# Patient Record
Sex: Female | Born: 1995
Health system: Southern US, Community
[De-identification: ages and names within clinical notes are randomized; demographics above are authoritative.]

## PROBLEM LIST (undated history)

## (undated) DIAGNOSIS — F909 Attention-deficit hyperactivity disorder, unspecified type: Secondary | ICD-10-CM

## (undated) DIAGNOSIS — E669 Obesity, unspecified: Secondary | ICD-10-CM

## (undated) DIAGNOSIS — J189 Pneumonia, unspecified organism: Secondary | ICD-10-CM

## (undated) DIAGNOSIS — E282 Polycystic ovarian syndrome: Secondary | ICD-10-CM

## (undated) DIAGNOSIS — Z87442 Personal history of urinary calculi: Secondary | ICD-10-CM

## (undated) HISTORY — DX: Obesity, unspecified: E66.9

## (undated) HISTORY — PX: WISDOM TOOTH EXTRACTION: SHX21

---

## 2018-09-27 ENCOUNTER — Emergency Department: Payer: 59

## 2018-09-27 ENCOUNTER — Emergency Department
Admission: EM | Admit: 2018-09-27 | Discharge: 2018-09-27 | Disposition: A | Discharge status: Home / Self Care | Payer: 59 | Attending: Emergency Medicine | Admitting: Emergency Medicine

## 2018-09-27 ENCOUNTER — Other Ambulatory Visit: Payer: Self-pay

## 2018-09-27 DIAGNOSIS — R1084 Generalized abdominal pain: Secondary | ICD-10-CM | POA: Insufficient documentation

## 2018-09-27 DIAGNOSIS — R112 Nausea with vomiting, unspecified: Secondary | ICD-10-CM

## 2018-09-27 DIAGNOSIS — E860 Dehydration: Secondary | ICD-10-CM

## 2018-09-27 DIAGNOSIS — R197 Diarrhea, unspecified: Secondary | ICD-10-CM | POA: Insufficient documentation

## 2018-09-27 DIAGNOSIS — A0472 Enterocolitis due to Clostridium difficile, not specified as recurrent: Secondary | ICD-10-CM

## 2018-09-27 LAB — GASTROINTESTINAL PANEL BY PCR, STOOL (REPLACES STOOL CULTURE)

## 2018-09-27 LAB — URINALYSIS, COMPLETE (UACMP) WITH MICROSCOPIC
Bilirubin Urine: NEGATIVE
Glucose, UA: NEGATIVE mg/dL
HGB URINE DIPSTICK: NEGATIVE
Ketones, ur: 5 mg/dL — AB
Leukocytes,Ua: NEGATIVE
Nitrite: NEGATIVE
Protein, ur: NEGATIVE mg/dL
Specific Gravity, Urine: 1.025 (ref 1.005–1.030)
pH: 5 (ref 5.0–8.0)

## 2018-09-27 LAB — POCT PREGNANCY, URINE: Preg Test, Ur: NEGATIVE

## 2018-09-27 LAB — C DIFFICILE QUICK SCREEN W PCR REFLEX
C Diff antigen: POSITIVE — AB
C Diff toxin: NEGATIVE

## 2018-09-27 LAB — CBC
HEMATOCRIT: 38.5 % (ref 36.0–46.0)
Hemoglobin: 12.6 g/dL (ref 12.0–15.0)
MCH: 29.3 pg (ref 26.0–34.0)
MCHC: 32.7 g/dL (ref 30.0–36.0)
MCV: 89.5 fL (ref 80.0–100.0)
Platelets: 228 10*3/uL (ref 150–400)
RBC: 4.3 MIL/uL (ref 3.87–5.11)
RDW: 12.9 % (ref 11.5–15.5)
WBC: 4.4 10*3/uL (ref 4.0–10.5)
nRBC: 0 % (ref 0.0–0.2)

## 2018-09-27 LAB — COMPREHENSIVE METABOLIC PANEL
ALBUMIN: 3.4 g/dL — AB (ref 3.5–5.0)
ALT: 49 U/L — AB (ref 0–44)
AST: 29 U/L (ref 15–41)
Alkaline Phosphatase: 63 U/L (ref 38–126)
Anion gap: 7 (ref 5–15)
BUN: 8 mg/dL (ref 6–20)
CO2: 22 mmol/L (ref 22–32)
Calcium: 8.9 mg/dL (ref 8.9–10.3)
Chloride: 107 mmol/L (ref 98–111)
Creatinine, Ser: 0.82 mg/dL (ref 0.44–1.00)
GFR calc Af Amer: >60 mL/min (ref >60)
GFR calc non Af Amer: >60 mL/min (ref >60)
GLUCOSE: 151 mg/dL — AB (ref 70–99)
Potassium: 3.6 mmol/L (ref 3.5–5.1)
Sodium: 136 mmol/L (ref 135–145)
Total Bilirubin: 0.2 mg/dL — ABNORMAL LOW (ref 0.3–1.2)
Total Protein: 7.2 g/dL (ref 6.5–8.1)

## 2018-09-27 LAB — CLOSTRIDIUM DIFFICILE BY PCR, REFLEXED: Toxigenic C. Difficile by PCR: NEGATIVE

## 2018-09-27 MED ORDER — IOHEXOL 300 MG/ML  SOLN
100.0000 mL | Freq: Once | INTRAMUSCULAR | Status: AC | PRN
  Administered 2018-09-27: 100 mL via INTRAVENOUS

## 2018-09-27 MED ORDER — ONDANSETRON HCL 4 MG/2ML IJ SOLN
INTRAMUSCULAR | Status: AC
  Administered 2018-09-27: 4 mg via INTRAVENOUS
  Filled 2018-09-27: qty 2

## 2018-09-27 MED ORDER — DICYCLOMINE HCL 20 MG PO TABS: 20.0000 mg | ORAL_TABLET | Freq: Four times a day (QID) | ORAL | 0 refills | Status: DC | PRN

## 2018-09-27 MED ORDER — ONDANSETRON HCL 4 MG/2ML IJ SOLN
4.0000 mg | Freq: Once | INTRAMUSCULAR | Status: AC
  Administered 2018-09-27: 4 mg via INTRAVENOUS
  Filled 2018-09-27: qty 2

## 2018-09-27 MED ORDER — ONDANSETRON 4 MG PO TBDP: 4.0000 mg | ORAL_TABLET | Freq: Three times a day (TID) | ORAL | 0 refills | Status: DC | PRN

## 2018-09-27 MED ORDER — METRONIDAZOLE 500 MG PO TABS: 500.0000 mg | ORAL_TABLET | Freq: Three times a day (TID) | ORAL | 0 refills | Status: AC

## 2018-09-27 MED ORDER — IOPAMIDOL (ISOVUE-300) INJECTION 61%
30.0000 mL | Freq: Once | INTRAVENOUS | Status: AC | PRN
  Administered 2018-09-27: 30 mL via ORAL

## 2018-09-27 MED ORDER — ONDANSETRON HCL 4 MG/2ML IJ SOLN
4.0000 mg | Freq: Once | INTRAMUSCULAR | Status: AC
  Administered 2018-09-27: 4 mg via INTRAVENOUS

## 2018-09-27 MED ORDER — SODIUM CHLORIDE 0.9 % IV BOLUS
1000.0000 mL | Freq: Once | INTRAVENOUS | Status: AC
  Administered 2018-09-27: 1000 mL via INTRAVENOUS

## 2018-09-27 NOTE — ED Notes (Signed)
Pt small emesis, CT called for finished drinking contrast

## 2018-09-27 NOTE — ED Provider Notes (Signed)
-----------------------------------------   8:11 AM on 09/27/2018 -----------------------------------------  Patient's results have returned positive for C. difficile antigen although negative for C. difficile toxin.  Normal white blood cell count.  However given her diarrhea we will cover the patient with metronidazole 3 times daily x10 days.  Patient agreeable to plan of care.  I discussed return precautions.   Minna Antis, MD 09/27/18 [phone]

## 2018-09-27 NOTE — Discharge Instructions (Addendum)
1.  You may take medicines as needed for abdominal cramps and nausea/vomiting (Bentyl/Zofran #20). 2.  Clear liquids x12 hours, then [employer] x3 days, then slowly advance diet as tolerated. 3.  Return to the ER for worsening symptoms, persistent vomiting, difficulty breathing or other concerns.

## 2018-09-27 NOTE — ED Triage Notes (Signed)
Pt states 4 days of nausea, vomiting and diarrhea. Pt states she has had a fever at home. Pt states she was seen by PMD and given nausea medication that is not helping. Pt appears in no acute distress.

## 2018-09-27 NOTE — ED Notes (Signed)
Patient transported to CT 

## 2018-09-27 NOTE — ED Notes (Signed)
Pt c/o N/V/D/cough/fever (no temp)/chills/poor appetite for the last four days  Pt has been drinking Pedialyte and taking amoxicillin for one day from [facility] care d/t cough

## 2018-09-27 NOTE — ED Provider Notes (Signed)
[facility] Emergency Department Provider Note   ____________________________________________   First MD Initiated Contact with Patient 09/27/18 [phone]     (approximate)  I have reviewed the triage vital signs and the nursing notes.   HISTORY  Chief Complaint Emesis and Diarrhea    HPI Jaime Hernandez is a 23 y.o. female who presents to the ED from home with a chief complaint of abdominal cramps, nausea/vomiting/diarrhea.  Patient had cold-like symptoms last week.  Endorses a 4-day history of the above symptoms.  Also endorses subjective fevers.  States she was seen by her PCP, started on amoxicillin (does not know why) and given some nausea medicine that she does not recall which does not help her nausea.  Last emesis around 1 AM.  Last episode of diarrhea approximately 1 hour ago.  Denies chest pain, shortness of breath, dysuria.  Denies recent travel or trauma.    Past medical history None  There are no active problems to display for this patient.    Prior to Admission medications   Medication Sig Start Date End Date Taking? Authorizing Provider  dicyclomine (BENTYL) 20 MG tablet Take 1 tablet (20 mg total) by mouth every 6 (six) hours as needed. 09/27/18   Irean Hong, MD  ondansetron (ZOFRAN ODT) 4 MG disintegrating tablet Take 1 tablet (4 mg total) by mouth every 8 (eight) hours as needed for nausea or vomiting. 09/27/18   Irean Hong, MD    Allergies Patient has no known allergies.  No family history on file.  Social History Social History   Tobacco Use  . Smoking status: Not on file  Substance Use Topics  . Alcohol use: Not on file  . Drug use: Not on file    Review of Systems  Constitutional: No fever/chills Eyes: No visual changes. ENT: No sore throat. Cardiovascular: Denies chest pain. Respiratory: Denies shortness of breath. Gastrointestinal: Positive for abdominal pain, nausea, vomiting and diarrhea.  No  constipation. Genitourinary: Negative for dysuria. Musculoskeletal: Negative for back pain. Skin: Negative for rash. Neurological: Negative for headaches, focal weakness or numbness.   ____________________________________________   PHYSICAL EXAM:  VITAL SIGNS: ED Triage Vitals [09/27/18 0139]  Enc Vitals Group     BP 126/71     Pulse Rate 88     Resp 16     Temp 98.5 F (36.9 C)     Temp Source Oral     SpO2 100 %     Weight 212 lb (96.2 kg)     Height 5\' 5"  (1.651 m)     Head Circumference      Peak Flow      Pain Score 7     Pain Loc      Pain Edu?      Excl. in [place]?     Constitutional: Alert and oriented. Well appearing and in mild acute distress. Eyes: Conjunctivae are normal. PERRL. EOMI. Head: Atraumatic. Nose: No congestion/rhinnorhea. Mouth/Throat: Mucous membranes are moist.  Oropharynx non-erythematous. Neck: No stridor.   Cardiovascular: Normal rate, regular rhythm. Grossly normal heart sounds.  Good peripheral circulation. Respiratory: Normal respiratory effort.  No retractions. Lungs CTAB. Gastrointestinal: Soft and mildly diffusely tender to palpation without rebound or guarding. No distention. No abdominal bruits. No CVA tenderness. Musculoskeletal: No lower extremity tenderness nor edema.  No joint effusions. Neurologic:  Normal speech and language. No gross focal neurologic deficits are appreciated. No gait instability. Skin:  Skin is warm, dry and intact. No rash  noted. Psychiatric: Mood and affect are normal. Speech and behavior are normal.  ____________________________________________   LABS (all labs ordered are listed, but only abnormal results are displayed)  Labs Reviewed  COMPREHENSIVE METABOLIC PANEL - Abnormal; Notable for the following components:      Result Value   Glucose, Bld 151 (*)    Albumin 3.4 (*)    ALT 49 (*)    Total Bilirubin 0.2 (*)    All other components within normal limits  URINALYSIS, COMPLETE (UACMP) WITH  MICROSCOPIC - Abnormal; Notable for the following components:   Color, Urine AMBER (*)    APPearance HAZY (*)    Ketones, ur 5 (*)    Bacteria, UA FEW (*)    All other components within normal limits  C DIFFICILE QUICK SCREEN W PCR REFLEX  GASTROINTESTINAL PANEL BY PCR, STOOL (REPLACES STOOL CULTURE)  CBC  POC URINE PREG, ED  POCT PREGNANCY, URINE   ____________________________________________  EKG  None ____________________________________________  RADIOLOGY  ED MD interpretation: Mild enteritis  Official radiology report(s): Ct Abdomen Pelvis W Contrast  Result Date: 09/27/2018 CLINICAL DATA:  23 year old female with abdominal cramping. EXAM: CT ABDOMEN AND PELVIS WITH CONTRAST TECHNIQUE: Multidetector CT imaging of the abdomen and pelvis was performed using the standard protocol following bolus administration of intravenous contrast. CONTRAST:  OMNIPAQUE IOHEXOL 300 MG/ML  SOLN COMPARISON:  None. FINDINGS: Lower chest: The visualized lung bases are clear. No intra-abdominal free air or free fluid. Hepatobiliary: No focal liver abnormality is seen. No gallstones, gallbladder wall thickening, or biliary dilatation. Pancreas: Unremarkable. No pancreatic ductal dilatation or surrounding inflammatory changes. Spleen: Normal in size without focal abnormality. Adrenals/Urinary Tract: Adrenal glands are unremarkable. Kidneys are normal, without renal calculi, focal lesion, or hydronephrosis. Bladder is unremarkable. Stomach/Bowel: There is no bowel obstruction or active inflammation. There is apparent minimal engorgement of the mesenteric vasculature which may be reactive. Clinical correlation is recommended to exclude mild enteritis. The appendix is normal. Vascular/Lymphatic: No significant vascular findings are present. No enlarged abdominal or pelvic lymph nodes. Reproductive: The uterus and ovaries are grossly unremarkable. No pelvic mass. Other: None Musculoskeletal: No acute or  significant osseous findings. IMPRESSION: Findings may represent mild enteritis. Clinical correlation is recommended. No bowel obstruction. Normal appendix. Electronically Signed   By: Elgie Collard M.D.   On: 09/27/2018 06:02    ____________________________________________   PROCEDURES  Procedure(s) performed: None  Procedures  Critical Care performed: No  ____________________________________________   INITIAL IMPRESSION / ASSESSMENT AND PLAN / ED COURSE  As part of my medical decision making, I reviewed the following data within the electronic MEDICAL RECORD NUMBER History obtained from family, Nursing notes reviewed and incorporated, Labs reviewed, Old chart reviewed, Radiograph reviewed and Notes from prior ED visits    23 year old otherwise healthy female who presents with a 4-day history of abdominal cramping, nausea/vomiting/diarrhea. Differential diagnosis includes, but is not limited to, ovarian cyst, ovarian torsion, acute appendicitis, diverticulitis, urinary tract infection/pyelonephritis, endometriosis, bowel obstruction, colitis, renal colic, gastroenteritis, hernia, etc.  Laboratory results remarkable for mild dehydration.  Will initiate IV fluid resuscitation, 4 mg IV Zofran for nausea and proceed with CT abdomen/pelvis to evaluate intra-abdominal etiology of patient's symptoms.   Clinical Course as of Sep 27 654  Sat Sep 27, 2018  1610 Patient vomited prior to going to CT scan.  He was redosed with IV Zofran.  Awaiting CT results.   [JS]  0607 Updated patient of CT results.  She has not been able  to provide stool specimen.  Will try ice chips.   [JS]  [id number] Patient tolerated ice chips without emesis.  She has provided stool specimen.  I did asked patient to at least wait for the results of C. difficile.  If she wants to be discharged before results of [employer] I think that is reasonable as that takes longer to result.   [JS]  [id number] Transferred care of patient to Dr.  Lenard Lance pending C. difficile results.   [JS]    Clinical Course User Index [JS] Irean Hong, MD     ____________________________________________   FINAL CLINICAL IMPRESSION(S) / ED DIAGNOSES  Final diagnoses:  Nausea vomiting and diarrhea  Dehydration  Generalized abdominal pain     ED Discharge Orders         Ordered    dicyclomine (BENTYL) 20 MG tablet  Every 6 hours PRN     09/27/18 0637    ondansetron (ZOFRAN ODT) 4 MG disintegrating tablet  Every 8 hours PRN     09/27/18 2620           Note:  This document was prepared using Dragon voice recognition software and may include unintentional dictation errors.   Irean Hong, MD 09/27/18 [phone]

## 2019-03-02 ENCOUNTER — Other Ambulatory Visit: Payer: Self-pay

## 2019-03-02 DIAGNOSIS — Z20822 Contact with and (suspected) exposure to covid-19: Secondary | ICD-10-CM

## 2019-03-04 LAB — NOVEL CORONAVIRUS, NAA: SARS-CoV-2, NAA: NOT DETECTED

## 2019-09-20 ENCOUNTER — Encounter: Payer: Self-pay | Admitting: *Deleted

## 2019-09-20 ENCOUNTER — Other Ambulatory Visit: Payer: Self-pay

## 2019-09-20 DIAGNOSIS — R197 Diarrhea, unspecified: Secondary | ICD-10-CM | POA: Diagnosis present

## 2019-09-20 DIAGNOSIS — Z20822 Contact with and (suspected) exposure to covid-19: Secondary | ICD-10-CM | POA: Insufficient documentation

## 2019-09-20 DIAGNOSIS — R112 Nausea with vomiting, unspecified: Secondary | ICD-10-CM | POA: Diagnosis not present

## 2019-09-20 DIAGNOSIS — B349 Viral infection, unspecified: Secondary | ICD-10-CM | POA: Insufficient documentation

## 2019-09-20 DIAGNOSIS — R509 Fever, unspecified: Secondary | ICD-10-CM | POA: Insufficient documentation

## 2019-09-20 LAB — CBC
HCT: 41.6 % (ref 36.0–46.0)
Hemoglobin: 13.7 g/dL (ref 12.0–15.0)
MCH: 29.8 pg (ref 26.0–34.0)
MCHC: 32.9 g/dL (ref 30.0–36.0)
MCV: 90.6 fL (ref 80.0–100.0)
Platelets: 272 10*3/uL (ref 150–400)
RBC: 4.59 MIL/uL (ref 3.87–5.11)
RDW: 12.9 % (ref 11.5–15.5)
WBC: 11.8 10*3/uL — ABNORMAL HIGH (ref 4.0–10.5)
nRBC: 0 % (ref 0.0–0.2)

## 2019-09-20 LAB — COMPREHENSIVE METABOLIC PANEL
ALT: 21 U/L (ref 0–44)
AST: 19 U/L (ref 15–41)
Albumin: 3.7 g/dL (ref 3.5–5.0)
Alkaline Phosphatase: 71 U/L (ref 38–126)
Anion gap: 9 (ref 5–15)
BUN: 15 mg/dL (ref 6–20)
CO2: 22 mmol/L (ref 22–32)
Calcium: 8.9 mg/dL (ref 8.9–10.3)
Chloride: 106 mmol/L (ref 98–111)
Creatinine, Ser: 0.69 mg/dL (ref 0.44–1.00)
GFR calc Af Amer: >60 mL/min (ref >60)
GFR calc non Af Amer: >60 mL/min (ref >60)
Glucose, Bld: 111 mg/dL — ABNORMAL HIGH (ref 70–99)
Potassium: 4.1 mmol/L (ref 3.5–5.1)
Sodium: 137 mmol/L (ref 135–145)
Total Bilirubin: 0.5 mg/dL (ref 0.3–1.2)
Total Protein: 7.4 g/dL (ref 6.5–8.1)

## 2019-09-20 LAB — URINALYSIS, COMPLETE (UACMP) WITH MICROSCOPIC
Bilirubin Urine: NEGATIVE
Glucose, UA: NEGATIVE mg/dL
Hgb urine dipstick: NEGATIVE
Ketones, ur: NEGATIVE mg/dL
Nitrite: NEGATIVE
Protein, ur: NEGATIVE mg/dL
Specific Gravity, Urine: 1.024 (ref 1.005–1.030)
Squamous Epithelial / HPF: NONE SEEN (ref 0–5)
pH: 5 (ref 5.0–8.0)

## 2019-09-20 LAB — LIPASE, BLOOD: Lipase: 22 U/L (ref 11–51)

## 2019-09-20 MED ORDER — ONDANSETRON 4 MG PO TBDP: 4.0000 mg | ORAL_TABLET | Freq: Once | ORAL | Status: AC

## 2019-09-20 MED ORDER — ONDANSETRON 4 MG PO TBDP
ORAL_TABLET | ORAL | Status: AC
  Administered 2019-09-20: 4 mg via ORAL
  Filled 2019-09-20: qty 1

## 2019-09-20 NOTE — ED Notes (Signed)
Pt reporting last Zofran was taken at 15:00 today. Pt vomiting in the bathroom and reporting feeling lightheaded. Pt placed in a wheelchair and reporting she feels better sitting. Medication given.

## 2019-09-20 NOTE — ED Triage Notes (Signed)
PT to ED reporting sudden onset NVD today. Pt reports eating out last night and had a rare steak but did not feel bad immediatly after and did not feel as though it was bad. No one else that ate dinner was sick. Boyfriend has been coughing. Pt took zofran at home with slight relief. Chills but no known fevers at home.   No changes in urine

## 2019-09-21 ENCOUNTER — Emergency Department
Admission: EM | Admit: 2019-09-21 | Discharge: 2019-09-21 | Disposition: A | Discharge status: Home / Self Care | Payer: 59 | Attending: Emergency Medicine | Admitting: Emergency Medicine

## 2019-09-21 DIAGNOSIS — R112 Nausea with vomiting, unspecified: Secondary | ICD-10-CM

## 2019-09-21 LAB — POC SARS CORONAVIRUS 2 AG: SARS Coronavirus 2 Ag: NEGATIVE

## 2019-09-21 LAB — POCT PREGNANCY, URINE: Preg Test, Ur: NEGATIVE

## 2019-09-21 LAB — PREGNANCY, URINE: Preg Test, Ur: NEGATIVE

## 2019-09-21 LAB — SARS CORONAVIRUS 2 (TAT 6-24 HRS): SARS Coronavirus 2: NEGATIVE

## 2019-09-21 MED ORDER — ONDANSETRON 4 MG PO TBDP: 4.0000 mg | ORAL_TABLET | Freq: Three times a day (TID) | ORAL | 0 refills | Status: DC | PRN

## 2019-09-21 NOTE — ED Provider Notes (Signed)
[facility] Emergency Department Provider Note  Time seen: 2:31 AM  I have reviewed the triage vital signs and the nursing notes.   HISTORY  Chief Complaint Diarrhea    HPI Jaime Hernandez is a 24 y.o. female with no past medical history presents to the emergency department for 2days of subjective fever nausea vomiting diarrhea.  According to the patient for the past 2 days or so she has had an occasional cough, over the past 12 hours has developed nausea vomiting diarrhea and subjective fevers.  States her boyfriend at home has similar symptoms as well.  Patient has not measured a temperature at home, 99.6 in the emergency department.  States intermittent mild headaches as well.   History reviewed. No pertinent past medical history.  There are no problems to display for this patient.   History reviewed. No pertinent surgical history.  Prior to Admission medications   Medication Sig Start Date End Date Taking? Authorizing Provider  dicyclomine (BENTYL) 20 MG tablet Take 1 tablet (20 mg total) by mouth every 6 (six) hours as needed. 09/27/18   Paulette Blanch, MD  ondansetron (ZOFRAN ODT) 4 MG disintegrating tablet Take 1 tablet (4 mg total) by mouth every 8 (eight) hours as needed for nausea or vomiting. 09/27/18   Paulette Blanch, MD    No Known Allergies  History reviewed. No pertinent family history.  Social History Social History   Tobacco Use  . Smoking status: Never Smoker  . Smokeless tobacco: Never Used  Substance Use Topics  . Alcohol use: Yes    Comment: socially  . Drug use: Never    Review of Systems Constitutional: Subjective fever/chills Cardiovascular: Negative for chest pain. Respiratory: Negative for shortness of breath. Gastrointestinal: Negative for abdominal pain.  Positive for nausea vomiting diarrhea Musculoskeletal: Negative for musculoskeletal complaints Neurological: Intermittent headaches. All other ROS  negative  ____________________________________________   PHYSICAL EXAM:  VITAL SIGNS: ED Triage Vitals  Enc Vitals Group     BP 09/20/19 2204 115/71     Pulse Rate 09/20/19 2204 (!) 108     Resp 09/20/19 2204 16     Temp 09/20/19 2204 99.6 F (37.6 C)     Temp Source 09/20/19 2204 Oral     SpO2 09/20/19 2204 100 %     Weight 09/20/19 2205 212 lb (96.2 kg)     Height 09/20/19 2205 5\' 5"  (1.651 m)     Head Circumference --      Peak Flow --      Pain Score 09/20/19 2204 7     Pain Loc --      Pain Edu? --      Excl. in [place]? --     Constitutional: Alert and oriented. Well appearing and in no distress. Eyes: Normal exam ENT      Head: Normocephalic and atraumatic.      Mouth/Throat: Mucous membranes are moist. Cardiovascular: Normal rate, regular rhythm.  Respiratory: Normal respiratory effort without tachypnea nor retractions. Breath sounds are clear  Gastrointestinal: Soft and nontender. No distention.   Musculoskeletal: Nontender with normal range of motion in all extremities. Neurologic:  Normal speech and language. No gross focal neurologic deficits Skin:  Skin is warm, dry and intact.  Psychiatric: Mood and affect are normal.   ____________________________________________   INITIAL IMPRESSION / ASSESSMENT AND PLAN / ED COURSE  Pertinent labs & imaging results that were available during my care of the patient were reviewed by me and  considered in my medical decision making (see chart for details).   Patient presents to the emergency department for nausea vomiting diarrhea subjective chills, intermittent headaches.  Patient is concerned she could have Covid.  Patient's labs are largely within normal limits.  Her rapid Covid is negative.  Patient's physical exam and vitals are reassuring.  Overall clinical picture is consistent with a viral process.  We will send a Covid PCR, discharge with supportive care and Zofran.  Discussed quarantine/isolation precautions until her  PCR results are known.  Jaime Hernandez was evaluated in Emergency Department on 09/21/2019 for the symptoms described in the history of present illness. She was evaluated in the context of the global COVID-19 pandemic, which necessitated consideration that the patient might be at risk for infection with the SARS-CoV-2 virus that causes COVID-19. Institutional protocols and algorithms that pertain to the evaluation of patients at risk for COVID-19 are in a state of rapid change based on information released by regulatory bodies including the CDC and federal and state organizations. These policies and algorithms were followed during the patient's care in the ED.  ____________________________________________   FINAL CLINICAL IMPRESSION(S) / ED DIAGNOSES  Viral illness    Jaime Antis, MD 09/21/19 [phone]

## 2020-06-08 ENCOUNTER — Encounter: Payer: Self-pay | Admitting: Emergency Medicine

## 2020-06-08 ENCOUNTER — Other Ambulatory Visit: Payer: Self-pay

## 2020-06-08 DIAGNOSIS — N83202 Unspecified ovarian cyst, left side: Secondary | ICD-10-CM | POA: Diagnosis not present

## 2020-06-08 DIAGNOSIS — N83201 Unspecified ovarian cyst, right side: Secondary | ICD-10-CM | POA: Insufficient documentation

## 2020-06-08 DIAGNOSIS — R102 Pelvic and perineal pain: Secondary | ICD-10-CM | POA: Diagnosis present

## 2020-06-08 LAB — COMPREHENSIVE METABOLIC PANEL
ALT: 19 U/L (ref 0–44)
AST: 17 U/L (ref 15–41)
Albumin: 3.6 g/dL (ref 3.5–5.0)
Alkaline Phosphatase: 77 U/L (ref 38–126)
Anion gap: 9 (ref 5–15)
BUN: 10 mg/dL (ref 6–20)
CO2: 24 mmol/L (ref 22–32)
Calcium: 8.9 mg/dL (ref 8.9–10.3)
Chloride: 107 mmol/L (ref 98–111)
Creatinine, Ser: 0.67 mg/dL (ref 0.44–1.00)
GFR, Estimated: >60 mL/min (ref >60)
Glucose, Bld: 140 mg/dL — ABNORMAL HIGH (ref 70–99)
Potassium: 3.8 mmol/L (ref 3.5–5.1)
Sodium: 140 mmol/L (ref 135–145)
Total Bilirubin: 0.4 mg/dL (ref 0.3–1.2)
Total Protein: 7.4 g/dL (ref 6.5–8.1)

## 2020-06-08 LAB — CBC
HCT: 38.9 % (ref 36.0–46.0)
Hemoglobin: 12.6 g/dL (ref 12.0–15.0)
MCH: 29.3 pg (ref 26.0–34.0)
MCHC: 32.4 g/dL (ref 30.0–36.0)
MCV: 90.5 fL (ref 80.0–100.0)
Platelets: 308 10*3/uL (ref 150–400)
RBC: 4.3 MIL/uL (ref 3.87–5.11)
RDW: 13.4 % (ref 11.5–15.5)
WBC: 12.2 10*3/uL — ABNORMAL HIGH (ref 4.0–10.5)
nRBC: 0 % (ref 0.0–0.2)

## 2020-06-08 LAB — POC URINE PREG, ED: Preg Test, Ur: NEGATIVE

## 2020-06-08 LAB — URINALYSIS, COMPLETE (UACMP) WITH MICROSCOPIC
Bilirubin Urine: NEGATIVE
Glucose, UA: NEGATIVE mg/dL
Hgb urine dipstick: NEGATIVE
Ketones, ur: NEGATIVE mg/dL
Leukocytes,Ua: NEGATIVE
Nitrite: NEGATIVE
Protein, ur: NEGATIVE mg/dL
Specific Gravity, Urine: 1.024 (ref 1.005–1.030)
pH: 5 (ref 5.0–8.0)

## 2020-06-08 LAB — LIPASE, BLOOD: Lipase: 23 U/L (ref 11–51)

## 2020-06-08 NOTE — ED Triage Notes (Signed)
Patient states that she has PCOS. Patient with complaint of lower abdominal pain with nausea times week and half. Patient denies vomiting or urinary symptoms.

## 2020-06-09 ENCOUNTER — Emergency Department
Admission: EM | Admit: 2020-06-09 | Discharge: 2020-06-09 | Disposition: A | Discharge status: Home / Self Care | Payer: 59 | Attending: Emergency Medicine | Admitting: Emergency Medicine

## 2020-06-09 ENCOUNTER — Emergency Department: Payer: 59

## 2020-06-09 DIAGNOSIS — N83201 Unspecified ovarian cyst, right side: Secondary | ICD-10-CM

## 2020-06-09 DIAGNOSIS — R52 Pain, unspecified: Secondary | ICD-10-CM

## 2020-06-09 DIAGNOSIS — R102 Pelvic and perineal pain: Secondary | ICD-10-CM

## 2020-06-09 DIAGNOSIS — E282 Polycystic ovarian syndrome: Secondary | ICD-10-CM

## 2020-06-09 HISTORY — DX: Polycystic ovarian syndrome: E28.2

## 2020-06-09 MED ORDER — OXYCODONE-ACETAMINOPHEN 5-325 MG PO TABS
1.0000 | ORAL_TABLET | Freq: Once | ORAL | Status: AC
  Administered 2020-06-09: 1 via ORAL
  Filled 2020-06-09: qty 1

## 2020-06-09 MED ORDER — ONDANSETRON 4 MG PO TBDP
4.0000 mg | ORAL_TABLET | Freq: Once | ORAL | Status: AC
  Administered 2020-06-09: 4 mg via ORAL
  Filled 2020-06-09: qty 1

## 2020-06-09 MED ORDER — ONDANSETRON 4 MG PO TBDP: 4.0000 mg | ORAL_TABLET | Freq: Three times a day (TID) | ORAL | 0 refills | Status: DC | PRN

## 2020-06-09 MED ORDER — IBUPROFEN 800 MG PO TABS
800.0000 mg | ORAL_TABLET | Freq: Once | ORAL | Status: AC
  Administered 2020-06-09: 800 mg via ORAL
  Filled 2020-06-09: qty 1

## 2020-06-09 MED ORDER — OXYCODONE-ACETAMINOPHEN 5-325 MG PO TABS: 1.0000 | ORAL_TABLET | ORAL | 0 refills | Status: DC | PRN

## 2020-06-09 MED ORDER — IBUPROFEN 800 MG PO TABS: 800.0000 mg | ORAL_TABLET | Freq: Three times a day (TID) | ORAL | 0 refills | Status: DC | PRN

## 2020-06-09 NOTE — ED Provider Notes (Signed)
[facility] Emergency Department Provider Note   ____________________________________________   First MD Initiated Contact with Patient 06/09/20 [phone]     (approximate)  I have reviewed the triage vital signs and the nursing notes.   HISTORY  Chief Complaint Abdominal Pain    HPI Jaime Hernandez is a 24 y.o. female who presents to the ED from work with a chief complaint of pelvic pain.  Patient reports a history of PCOS.  Complains of pelvic pain times several weeks associated with nausea.  Denies associated fever, cough, chest pain, shortness of breath, abdominal pain, vomiting, dysuria, diarrhea.  Denies vaginal bleeding.  Patient was seen at urgent care on 05/17/2020.  Ran out of her OCP approximately 1 month prior to urgent care visit which is when her pelvic pain started.  She was restarted on Junel at that visit.     Past Medical History:  Diagnosis Date  . PCOS (polycystic ovarian syndrome)   Kidney stones  There are no problems to display for this patient.   Past Surgical History:  Procedure Laterality Date  . WISDOM TOOTH EXTRACTION      Prior to Admission medications   Medication Sig Start Date End Date Taking? Authorizing Provider  dicyclomine (BENTYL) 20 MG tablet Take 1 tablet (20 mg total) by mouth every 6 (six) hours as needed. 09/27/18   Irean Hong, MD  ibuprofen (ADVIL) 800 MG tablet Take 1 tablet (800 mg total) by mouth every 8 (eight) hours as needed for moderate pain. 06/09/20   Irean Hong, MD  ondansetron (ZOFRAN ODT) 4 MG disintegrating tablet Take 1 tablet (4 mg total) by mouth every 8 (eight) hours as needed for nausea or vomiting. 06/09/20   Irean Hong, MD  oxyCODONE-acetaminophen (PERCOCET/ROXICET) 5-325 MG tablet Take 1 tablet by mouth every 4 (four) hours as needed for severe pain. 06/09/20   Irean Hong, MD    Allergies Tylenol with codeine #3 [acetaminophen-codeine]  No family history on file.  Social  History Social History   Tobacco Use  . Smoking status: Never Smoker  . Smokeless tobacco: Never Used  Substance Use Topics  . Alcohol use: Yes    Comment: socially  . Drug use: Never    Review of Systems  Constitutional: No fever/chills Eyes: No visual changes. ENT: No sore throat. Cardiovascular: Denies chest pain. Respiratory: Denies shortness of breath. Gastrointestinal: No abdominal pain.  Positive for nausea, no vomiting.  No diarrhea.  No constipation. Genitourinary: Positive for pelvic pain.  Negative for dysuria. Musculoskeletal: Negative for back pain. Skin: Negative for rash. Neurological: Negative for headaches, focal weakness or numbness.   ____________________________________________   PHYSICAL EXAM:  VITAL SIGNS: ED Triage Vitals  Enc Vitals Group     BP 06/08/20 1932 137/78     Pulse Rate 06/08/20 1932 97     Resp 06/08/20 1932 18     Temp 06/08/20 1932 98.1 F (36.7 C)     Temp Source 06/08/20 1932 Oral     SpO2 06/08/20 1932 100 %     Weight 06/08/20 1933 220 lb (99.8 kg)     Height 06/08/20 1933 5\' 5"  (1.651 m)     Head Circumference --      Peak Flow --      Pain Score 06/08/20 1933 7     Pain Loc --      Pain Edu? --      Excl. in GC? --  Constitutional: Alert and oriented. Well appearing and in mild acute distress. Eyes: Conjunctivae are normal. PERRL. EOMI. Head: Atraumatic. Nose: No congestion/rhinnorhea. Mouth/Throat: Mucous membranes are moist.   Neck: No stridor.   Cardiovascular: Normal rate, regular rhythm. Grossly normal heart sounds.  Good peripheral circulation. Respiratory: Normal respiratory effort.  No retractions. Lungs CTAB. Gastrointestinal: Obese.  Soft and mildly tender across pelvis without rebound or guarding. No distention. No abdominal bruits. No CVA tenderness. Musculoskeletal: No lower extremity tenderness nor edema.  No joint effusions. Neurologic:  Normal speech and language. No gross focal neurologic  deficits are appreciated. No gait instability. Skin:  Skin is warm, dry and intact. No rash noted. Psychiatric: Mood and affect are normal. Speech and behavior are normal.  ____________________________________________   LABS (all labs ordered are listed, but only abnormal results are displayed)  Labs Reviewed  COMPREHENSIVE METABOLIC PANEL - Abnormal; Notable for the following components:      Result Value   Glucose, Bld 140 (*)    All other components within normal limits  CBC - Abnormal; Notable for the following components:   WBC 12.2 (*)    All other components within normal limits  URINALYSIS, COMPLETE (UACMP) WITH MICROSCOPIC - Abnormal; Notable for the following components:   Color, Urine YELLOW (*)    APPearance HAZY (*)    Bacteria, UA RARE (*)    All other components within normal limits  LIPASE, BLOOD  POC URINE PREG, ED   ____________________________________________  EKG  None ____________________________________________  RADIOLOGY I, Mikaela Hilgeman J, personally viewed and evaluated these images (plain radiographs) as part of my medical decision making, as well as reviewing the written report by the radiologist.  ED MD interpretation: Multiple bilateral ovarian cysts consistent with PCOS  Official radiology report(s): US PELVIC COMPLETE W TRANSVAGINAL AND TORSION R/O  Result Date: 06/09/2020 CLINICAL DATA:  Pelvic pain for 1.5 weeks, history of PCOS, negative UPT EXAM: TRANSABDOMINAL AND TRANSVAGINAL ULTRASOUND OF PELVIS DOPPLER ULTRASOUND OF OVARIES TECHNIQUE: Both transabdominal and transvaginal ultrasound examinations of the pelvis were performed. Transabdominal technique was performed for global imaging of the pelvis including uterus, ovaries, adnexal regions, and pelvic cul-de-sac. It was necessary to proceed with endovaginal exam following the transabdominal exam to visualize the endometrium and ovaries. Color and duplex Doppler ultrasound was utilized to  evaluate blood flow to the ovaries. COMPARISON:  CT abdomen pelvis 09/27/2018 FINDINGS: Uterus Measurements: 5.9 x 3.5 x 3.9 cm = volume: 42.7 mL. Retroflexed uterus. No concerning uterine mass or discernible fibroid. Endometrium Thickness: 3.7 mm, non thickened.  No focal abnormality visualized. Right ovary Measurements: 3.0 x 2.3 x 1.6 cm = volume: 5.9 mL. Multiple anechoic follicles organized around the central stroma in keeping with history of PCOS. No concerning adnexal lesion. Left ovary Measurements: 2.4 x 1.9 x 2.4 cm = volume: 5.4 mL. Multiple anechoic follicles organized around the central stroma in keeping with history of PCOS. No concerning adnexal lesion. Pulsed Doppler evaluation of both ovaries demonstrates normal low-resistance arterial and venous waveforms. Other findings No abnormal free fluid. IMPRESSION: 1. Multiple anechoic follicles organized around the central stroma of both ovaries in keeping with history of PCOS. 2. No acute pelvic abnormality is seen. 3. Retroflexed uterus. Electronically Signed   By: Kreg Shropshire M.D.   On: 06/09/2020 03:33    ____________________________________________   PROCEDURES  Procedure(s) performed (including Critical Care):  Procedures   ____________________________________________   INITIAL IMPRESSION / ASSESSMENT AND PLAN / ED COURSE  As part of my  medical decision making, I reviewed the following data within the electronic MEDICAL RECORD NUMBER Nursing notes reviewed and incorporated, Labs reviewed, Old chart reviewed ([facility] visit from 05/17/2020), Radiograph reviewed, Notes from prior ED visits (2 visits in the past 2 years for vomiting) and [place] Controlled Substance Database     24 year old female with PCOS presenting with pelvic pain x several weeks. Differential diagnosis includes, but is not limited to, ovarian cyst, ovarian torsion, acute appendicitis, diverticulitis, urinary tract infection/pyelonephritis, endometriosis, bowel obstruction,  colitis, renal colic, gastroenteritis, hernia, fibroids, endometriosis, pregnancy related pain including ectopic pregnancy, etc.  Laboratory and urinalysis results unremarkable.  Will medicate with NSAIDs, analgesia, Zofran.  Will proceed with pelvic ultrasound.  Patient has an appointment scheduled with [facility] on November 15.  Clinical Course as of Jun 09 614  Thu Jun 09, 2020  2330 Updated patient on ultrasound results.  Will discharge home with as needed prescriptions for Motrin, Percocet and Zofran.  Patient has GYN appointment already scheduled.  Strict return precautions given.  Patient verbalizes understanding and agrees with plan of care.   [JS]    Clinical Course User Index [JS] Irean Hong, MD     ____________________________________________   FINAL CLINICAL IMPRESSION(S) / ED DIAGNOSES  Final diagnoses:  Pain  Pelvic pain in female  PCOS (polycystic ovarian syndrome)  Bilateral ovarian cysts     ED Discharge Orders         Ordered    ibuprofen (ADVIL) 800 MG tablet  Every 8 hours PRN        06/09/20 0340    oxyCODONE-acetaminophen (PERCOCET/ROXICET) 5-325 MG tablet  Every 4 hours PRN        06/09/20 0340    ondansetron (ZOFRAN ODT) 4 MG disintegrating tablet  Every 8 hours PRN        06/09/20 0340          *Please note:  Danaysia Rader was evaluated in Emergency Department on 06/09/2020 for the symptoms described in the history of present illness. She was evaluated in the context of the global COVID-19 pandemic, which necessitated consideration that the patient might be at risk for infection with the SARS-CoV-2 virus that causes COVID-19. Institutional protocols and algorithms that pertain to the evaluation of patients at risk for COVID-19 are in a state of rapid change based on information released by regulatory bodies including the CDC and federal and state organizations. These policies and algorithms were followed during the patient's care in the ED.   Some ED evaluations and interventions may be delayed as a result of limited staffing during and the pandemic.*   Note:  This document was prepared using Dragon voice recognition software and may include unintentional dictation errors.   Irean Hong, MD 06/09/20 [phone]

## 2020-06-09 NOTE — Discharge Instructions (Addendum)
1.  You may take medicines as needed for pain and nausea (Motrin/Percocet/Zofran). 2.  Return to the ER for worsening symptoms, persistent vomiting, difficulty breathing or other concerns.

## 2020-06-09 NOTE — ED Notes (Signed)
To ultrasound via stretcher accompanied by ultrasound tech.  Pt denies pain prior to leaving ED.

## 2020-06-09 NOTE — ED Notes (Signed)
Pt able to tolerate po meds, denies nausea/vomiting after taking percocet and ibuprofen.  Zofran was given first since pt reports never taking percocet in past.

## 2020-06-20 ENCOUNTER — Other Ambulatory Visit: Payer: Self-pay

## 2020-06-20 ENCOUNTER — Encounter: Payer: Self-pay | Admitting: Obstetrics

## 2020-06-20 ENCOUNTER — Other Ambulatory Visit (HOSPITAL_COMMUNITY)
Admission: RE | Admit: 2020-06-20 | Discharge: 2020-06-20 | Disposition: A | Discharge status: Home / Self Care | Payer: 59 | Source: Ambulatory Visit | Attending: Obstetrics | Admitting: Obstetrics

## 2020-06-20 ENCOUNTER — Ambulatory Visit (INDEPENDENT_AMBULATORY_CARE_PROVIDER_SITE_OTHER): Payer: 59 | Admitting: Obstetrics

## 2020-06-20 VITALS — BP 130/70 | HR 66 | Wt 243.0 lb

## 2020-06-20 DIAGNOSIS — Z01419 Encounter for gynecological examination (general) (routine) without abnormal findings: Secondary | ICD-10-CM | POA: Insufficient documentation

## 2020-06-20 DIAGNOSIS — Z308 Encounter for other contraceptive management: Secondary | ICD-10-CM | POA: Diagnosis not present

## 2020-06-20 DIAGNOSIS — Z23 Encounter for immunization: Secondary | ICD-10-CM

## 2020-06-20 DIAGNOSIS — R300 Dysuria: Secondary | ICD-10-CM

## 2020-06-20 DIAGNOSIS — Z124 Encounter for screening for malignant neoplasm of cervix: Secondary | ICD-10-CM

## 2020-06-20 LAB — POCT URINALYSIS DIPSTICK
Bilirubin, UA: NEGATIVE
Blood, UA: POSITIVE
Glucose, UA: NEGATIVE
Ketones, UA: NEGATIVE
Nitrite, UA: NEGATIVE
Protein, UA: POSITIVE — AB
Spec Grav, UA: >=1.03 — AB (ref 1.010–1.025)
Urobilinogen, UA: NEGATIVE E.U./dL — AB
pH, UA: 5 (ref 5.0–8.0)

## 2020-06-20 MED ORDER — LO LOESTRIN FE 1 MG-10 MCG / 10 MCG PO TABS: 1.0000 | ORAL_TABLET | Freq: Every day | ORAL | 3 refills | Status: DC

## 2020-06-20 NOTE — Patient Instructions (Signed)
lestrin
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
## 2020-06-22 ENCOUNTER — Encounter: Payer: Self-pay | Admitting: Obstetrics

## 2020-06-22 LAB — CYTOLOGY - PAP: Diagnosis: NEGATIVE

## 2020-06-22 LAB — URINE CULTURE: Organism ID, Bacteria: NO GROWTH

## 2020-07-27 ENCOUNTER — Emergency Department: Payer: 59

## 2020-07-27 ENCOUNTER — Emergency Department
Admission: EM | Admit: 2020-07-27 | Discharge: 2020-07-27 | Disposition: A | Discharge status: Home / Self Care | Payer: 59 | Attending: Emergency Medicine | Admitting: Emergency Medicine

## 2020-07-27 ENCOUNTER — Other Ambulatory Visit: Payer: Self-pay

## 2020-07-27 ENCOUNTER — Encounter: Payer: Self-pay | Admitting: Emergency Medicine

## 2020-07-27 DIAGNOSIS — Z20822 Contact with and (suspected) exposure to covid-19: Secondary | ICD-10-CM | POA: Insufficient documentation

## 2020-07-27 DIAGNOSIS — R1030 Lower abdominal pain, unspecified: Secondary | ICD-10-CM

## 2020-07-27 DIAGNOSIS — R112 Nausea with vomiting, unspecified: Secondary | ICD-10-CM | POA: Insufficient documentation

## 2020-07-27 DIAGNOSIS — R1084 Generalized abdominal pain: Secondary | ICD-10-CM | POA: Diagnosis not present

## 2020-07-27 DIAGNOSIS — R197 Diarrhea, unspecified: Secondary | ICD-10-CM | POA: Diagnosis not present

## 2020-07-27 LAB — RESP PANEL BY RT-PCR (FLU A&B, COVID) ARPGX2
Influenza A by PCR: NEGATIVE
Influenza B by PCR: NEGATIVE
SARS Coronavirus 2 by RT PCR: NEGATIVE

## 2020-07-27 LAB — COMPREHENSIVE METABOLIC PANEL
ALT: 20 U/L (ref 0–44)
AST: 19 U/L (ref 15–41)
Albumin: 3.6 g/dL (ref 3.5–5.0)
Alkaline Phosphatase: 99 U/L (ref 38–126)
Anion gap: 9 (ref 5–15)
BUN: 15 mg/dL (ref 6–20)
CO2: 24 mmol/L (ref 22–32)
Calcium: 9.4 mg/dL (ref 8.9–10.3)
Chloride: 106 mmol/L (ref 98–111)
Creatinine, Ser: 0.42 mg/dL — ABNORMAL LOW (ref 0.44–1.00)
GFR, Estimated: >60 mL/min (ref >60)
Glucose, Bld: 113 mg/dL — ABNORMAL HIGH (ref 70–99)
Potassium: 3.8 mmol/L (ref 3.5–5.1)
Sodium: 139 mmol/L (ref 135–145)
Total Bilirubin: 0.4 mg/dL (ref 0.3–1.2)
Total Protein: 7.4 g/dL (ref 6.5–8.1)

## 2020-07-27 LAB — CBC WITH DIFFERENTIAL/PLATELET
Abs Immature Granulocytes: 0.07 10*3/uL (ref 0.00–0.07)
Basophils Absolute: 0.1 10*3/uL (ref 0.0–0.1)
Basophils Relative: 1 %
Eosinophils Absolute: 0.1 10*3/uL (ref 0.0–0.5)
Eosinophils Relative: 1 %
HCT: 42.7 % (ref 36.0–46.0)
Hemoglobin: 13.9 g/dL (ref 12.0–15.0)
Immature Granulocytes: 1 %
Lymphocytes Relative: 32 %
Lymphs Abs: 3.9 10*3/uL (ref 0.7–4.0)
MCH: 29.5 pg (ref 26.0–34.0)
MCHC: 32.6 g/dL (ref 30.0–36.0)
MCV: 90.7 fL (ref 80.0–100.0)
Monocytes Absolute: 0.6 10*3/uL (ref 0.1–1.0)
Monocytes Relative: 5 %
Neutro Abs: 7.4 10*3/uL (ref 1.7–7.7)
Neutrophils Relative %: 60 %
Platelets: 313 10*3/uL (ref 150–400)
RBC: 4.71 MIL/uL (ref 3.87–5.11)
RDW: 13.2 % (ref 11.5–15.5)
WBC: 12.2 10*3/uL — ABNORMAL HIGH (ref 4.0–10.5)
nRBC: 0 % (ref 0.0–0.2)

## 2020-07-27 LAB — HCG, QUANTITATIVE, PREGNANCY: hCG, Beta Chain, Quant, S: <1 m[IU]/mL (ref <5)

## 2020-07-27 LAB — LIPASE, BLOOD: Lipase: 23 U/L (ref 11–51)

## 2020-07-27 MED ORDER — DICYCLOMINE HCL 10 MG PO CAPS: 10.0000 mg | ORAL_CAPSULE | Freq: Three times a day (TID) | ORAL | 0 refills | Status: DC | PRN

## 2020-07-27 MED ORDER — ONDANSETRON HCL 4 MG PO TABS: 4.0000 mg | ORAL_TABLET | Freq: Three times a day (TID) | ORAL | 0 refills | Status: DC | PRN

## 2020-07-27 MED ORDER — ONDANSETRON HCL 4 MG/2ML IJ SOLN
4.0000 mg | Freq: Once | INTRAMUSCULAR | Status: AC
  Administered 2020-07-27: 4 mg via INTRAVENOUS
  Filled 2020-07-27: qty 2

## 2020-07-27 MED ORDER — KETOROLAC TROMETHAMINE 30 MG/ML IJ SOLN
30.0000 mg | Freq: Once | INTRAMUSCULAR | Status: AC
  Administered 2020-07-27: 14:00:00 30 mg via INTRAVENOUS
  Filled 2020-07-27: qty 1

## 2020-07-27 MED ORDER — IOHEXOL 9 MG/ML PO SOLN
500.0000 mL | Freq: Once | ORAL | Status: DC | PRN
  Administered 2020-07-27: 14:00:00 1000 mL via ORAL

## 2020-07-27 MED ORDER — IOHEXOL 300 MG/ML  SOLN
100.0000 mL | Freq: Once | INTRAMUSCULAR | Status: AC | PRN
  Administered 2020-07-27: 15:00:00 100 mL via INTRAVENOUS

## 2020-07-27 MED ORDER — SODIUM CHLORIDE 0.9 % IV BOLUS
1000.0000 mL | Freq: Once | INTRAVENOUS | Status: AC
  Administered 2020-07-27: 1000 mL via INTRAVENOUS

## 2020-07-27 NOTE — ED Notes (Signed)
Pt transported to CT ?

## 2020-07-27 NOTE — Discharge Instructions (Addendum)
Please seek medical attention for any high fevers, chest pain, shortness of breath, change in behavior, persistent vomiting, bloody stool or any other new or concerning symptoms.  

## 2020-07-27 NOTE — ED Notes (Signed)
This RN at bedside. Pt on bedside commode. Pt stating she has been vomiting and having diarrhea again. MD made aware

## 2020-07-27 NOTE — ED Provider Notes (Signed)
[facility] Emergency Department Provider Note   ____________________________________________   Event Date/Time   First MD Initiated Contact with Patient 07/27/20 1309     (approximate)  I have reviewed the triage vital signs and the nursing notes.   HISTORY  Chief Complaint Emesis and Abdominal Pain    HPI Jaime Hernandez is a 24 y.o. female with past medical history of of polycystic ovarian syndrome who presents for generalized abdominal pain with associated nausea/vomiting/diarrhea that began approximately 2 hours prior to arrival.  Patient states that she has been p.o. tolerant of a meal eaten at 9 AM.  Patient describes a cramping, right and left lower abdominal quadrant pain that does not radiate and is worsened with p.o. intake or vomiting/diarrhea.  Patient denies any recent sick contacts or travel.  Denies any recent medication changes.  States still has gallbladder and appendix.  Patient currently denies any vision changes, tinnitus, difficulty speaking, facial droop, sore throat, chest pain, shortness of breath, dysuria, or weakness/numbness/paresthesias in any extremity         Past Medical History:  Diagnosis Date  . Obesity   . PCOS (polycystic ovarian syndrome)   . PCOS (polycystic ovarian syndrome)     There are no problems to display for this patient.   Past Surgical History:  Procedure Laterality Date  . WISDOM TOOTH EXTRACTION      Prior to Admission medications   Medication Sig Start Date End Date Taking? Authorizing Provider  dicyclomine (BENTYL) 20 MG tablet Take 1 tablet (20 mg total) by mouth every 6 (six) hours as needed. Patient not taking: Reported on 06/20/2020 09/27/18   Irean Hong, MD  ibuprofen (ADVIL) 800 MG tablet Take 1 tablet (800 mg total) by mouth every 8 (eight) hours as needed for moderate pain. 06/09/20   Irean Hong, MD  Norethindrone-Ethinyl Estradiol-Fe Biphas (LO LOESTRIN FE) 1 MG-10 MCG / 10 MCG tablet  Take 1 tablet by mouth daily. 06/20/20 09/12/20  Mirna Mires, CNM  ondansetron (ZOFRAN ODT) 4 MG disintegrating tablet Take 1 tablet (4 mg total) by mouth every 8 (eight) hours as needed for nausea or vomiting. 06/09/20   Irean Hong, MD  oxyCODONE-acetaminophen (PERCOCET/ROXICET) 5-325 MG tablet Take 1 tablet by mouth every 4 (four) hours as needed for severe pain. 06/09/20   Irean Hong, MD    Allergies Acetaminophen-codeine  Family History  Problem Relation Age of Onset  . Cancer Maternal Uncle 6       thyroid    Social History Social History   Tobacco Use  . Smoking status: Never Smoker  . Smokeless tobacco: Never Used  Vaping Use  . Vaping Use: Some days  . Substances: CBD  Substance Use Topics  . Alcohol use: Yes    Comment: socially  . Drug use: Never    Review of Systems Constitutional: No fever/chills Eyes: No visual changes. ENT: No sore throat. Cardiovascular: Denies chest pain. Respiratory: Denies shortness of breath. Gastrointestinal: Endorses abdominal pain, nausea, vomiting, diarrhea Genitourinary: Negative for dysuria. Musculoskeletal: Negative for acute arthralgias Skin: Negative for rash. Neurological: Negative for headaches, weakness/numbness/paresthesias in any extremity Psychiatric: Negative for suicidal ideation/homicidal ideation   ____________________________________________   PHYSICAL EXAM:  VITAL SIGNS: ED Triage Vitals  Enc Vitals Group     BP 07/27/20 0558 128/75     Pulse Rate 07/27/20 0558 (!) 101     Resp 07/27/20 0558 20     Temp 07/27/20 0558 98.8 F (  37.1 C)     Temp Source 07/27/20 0558 Oral     SpO2 07/27/20 0558 98 %     Weight 07/27/20 0558 220 lb (99.8 kg)     Height 07/27/20 0558 5\' 5"  (1.651 m)     Head Circumference --      Peak Flow --      Pain Score 07/27/20 0557 6     Pain Loc --      Pain Edu? --      Excl. in GC? --    Constitutional: Alert and oriented. Well appearing and in no acute  distress. Eyes: Conjunctivae are normal. PERRL. Head: Atraumatic. Nose: No congestion/rhinnorhea. Mouth/Throat: Mucous membranes are moist. Neck: No stridor Cardiovascular: Grossly normal heart sounds.  Good peripheral circulation. Respiratory: Normal respiratory effort.  No retractions. Gastrointestinal: Soft and generalized tenderness to palpation. No distention. Musculoskeletal: No obvious deformities Neurologic:  Normal speech and language. No gross focal neurologic deficits are appreciated. Skin:  Skin is warm and dry. No rash noted. Psychiatric: Mood and affect are normal. Speech and behavior are normal.  ____________________________________________   LABS (all labs ordered are listed, but only abnormal results are displayed)  Labs Reviewed  CBC WITH DIFFERENTIAL/PLATELET - Abnormal; Notable for the following components:      Result Value   WBC 12.2 (*)    All other components within normal limits  COMPREHENSIVE METABOLIC PANEL - Abnormal; Notable for the following components:   Glucose, Bld 113 (*)    Creatinine, Ser 0.42 (*)    All other components within normal limits  LIPASE, BLOOD  HCG, QUANTITATIVE, PREGNANCY  URINALYSIS, COMPLETE (UACMP) WITH MICROSCOPIC    PROCEDURES  Procedure(s) performed (including Critical Care):  Procedures   ____________________________________________   INITIAL IMPRESSION / ASSESSMENT AND PLAN / ED COURSE  As part of my medical decision making, I reviewed the following data within the electronic MEDICAL RECORD NUMBER Nursing notes reviewed and incorporated, Labs reviewed, EKG interpreted, Old chart reviewed, Radiograph reviewed and Notes from prior ED visits reviewed and incorporated        Patient presents for acute nausea/vomiting  Given History and Exam there does not appear to be an emergent cause of the symptoms such as small bowel obstruction, coronary syndrome, bowel ischemia, DKA, pancreatitis, appendicitis, other acute  abdomen or other emergent problem.  Care of this patient will be signed out to the oncoming physician at the end of my shift.  All pertinent patient information conveyed and all questions answered.  All further care and disposition decisions will be made by the oncoming physician.      ____________________________________________   FINAL CLINICAL IMPRESSION(S) / ED DIAGNOSES  Final diagnoses:  None     ED Discharge Orders    None       Note:  This document was prepared using Dragon voice recognition software and may include unintentional dictation errors.   07/29/20, MD 07/27/20 1452

## 2020-07-27 NOTE — ED Triage Notes (Signed)
Patient ambulatory to triage with steady gait, without difficulty or distress noted; pt reports lower abd pain accomp by N/V/D since last night

## 2020-07-27 NOTE — ED Notes (Signed)
This RN at bedside to disconnect fluids. Pt stating have 2 watery BM prior to this RN coming into room

## 2020-07-27 NOTE — ED Provider Notes (Signed)
CT abd/pel returned without any significant findings or clear etiology of the patient's symptoms. At this time given reassuring CT scan doubt significant intraabdominal infection or mechanical obstruction. Discussed finding with patient. Will plan on discharging with prescription for zofran and bentyl.   Phineas Semen, MD 07/27/20 [phone]

## 2020-08-26 ENCOUNTER — Telehealth: Payer: Self-pay

## 2020-08-26 NOTE — Telephone Encounter (Signed)
Spoke w/patient. She stopped OCP in November d/t it making her sick. She had an ED visit 07/27/20 w/neg Beta Quant. Admits her first test that was + she took first thing in morning. Additional two test were taken randomly during the day. She does have sore nipples and feeling tired. Advised first am urine is more accurate especially in early pregnancy. Go with the +. Call to schedule first OB apt. Pt will call to report any new problematic symptoms of bleeding/cramping.

## 2020-08-26 NOTE — Telephone Encounter (Signed)
Patient had a positive hpt last week. 2 days later she took two more that were negative. Inquiring how to proceed. VV#[phone]

## 2020-09-11 ENCOUNTER — Emergency Department
Admission: EM | Admit: 2020-09-11 | Discharge: 2020-09-11 | Disposition: A | Discharge status: Home / Self Care | Payer: 59 | Attending: Emergency Medicine | Admitting: Emergency Medicine

## 2020-09-11 ENCOUNTER — Emergency Department: Payer: 59

## 2020-09-11 DIAGNOSIS — R059 Cough, unspecified: Secondary | ICD-10-CM

## 2020-09-11 DIAGNOSIS — U099 Post covid-19 condition, unspecified: Secondary | ICD-10-CM | POA: Insufficient documentation

## 2020-09-11 DIAGNOSIS — J40 Bronchitis, not specified as acute or chronic: Secondary | ICD-10-CM | POA: Insufficient documentation

## 2020-09-11 LAB — POC URINE PREG, ED: Preg Test, Ur: NEGATIVE

## 2020-09-11 MED ORDER — AZITHROMYCIN 500 MG PO TABS
500.0000 mg | ORAL_TABLET | Freq: Once | ORAL | Status: AC
  Administered 2020-09-11: 500 mg via ORAL
  Filled 2020-09-11: qty 1

## 2020-09-11 MED ORDER — AZITHROMYCIN 250 MG PO TABS: 250.0000 mg | ORAL_TABLET | Freq: Every day | ORAL | 0 refills | Status: DC

## 2020-09-11 MED ORDER — PREDNISONE 20 MG PO TABS
60.0000 mg | ORAL_TABLET | Freq: Once | ORAL | Status: AC
  Administered 2020-09-11: 60 mg via ORAL
  Filled 2020-09-11: qty 3

## 2020-09-11 MED ORDER — PREDNISONE 20 MG PO TABS: ORAL_TABLET | ORAL | 0 refills | Status: DC

## 2020-09-11 MED ORDER — ALBUTEROL SULFATE HFA 108 (90 BASE) MCG/ACT IN AERS: 2.0000 | INHALATION_SPRAY | RESPIRATORY_TRACT | 0 refills | Status: DC | PRN

## 2020-09-11 MED ORDER — BENZONATATE 100 MG PO CAPS
200.0000 mg | ORAL_CAPSULE | Freq: Once | ORAL | Status: AC
  Administered 2020-09-11: 200 mg via ORAL
  Filled 2020-09-11: qty 2

## 2020-09-11 MED ORDER — BENZONATATE 200 MG PO CAPS: 200.0000 mg | ORAL_CAPSULE | Freq: Three times a day (TID) | ORAL | 0 refills | Status: DC | PRN

## 2020-09-11 NOTE — ED Triage Notes (Signed)
Pt c/o cough and wheezing since diagnosis of COVID 1 month ago. Pt reports no relief by OTC medications.

## 2020-09-11 NOTE — ED Provider Notes (Signed)
[facility] Emergency Department Provider Note   ____________________________________________   Event Date/Time   First MD Initiated Contact with Patient 09/11/20 0132     (approximate)  I have reviewed the triage vital signs and the nursing notes.   HISTORY  Chief Complaint Cough    HPI Jaime Hernandez is a 25 y.o. female who presents to the ED from home with a chief complaint of cough and wheezing.  Patient had COVID-19 1 month ago.  Reports persistent dry cough with wheezing.  No relief with over-the-counter medications.  Denies fever, chest pain, shortness of breath, abdominal pain, nausea, vomiting or diarrhea.  Patient is vaccinated against COVID-19.     Past Medical History:  Diagnosis Date  . Obesity   . PCOS (polycystic ovarian syndrome)   . PCOS (polycystic ovarian syndrome)     There are no problems to display for this patient.   Past Surgical History:  Procedure Laterality Date  . WISDOM TOOTH EXTRACTION      Prior to Admission medications   Medication Sig Start Date End Date Taking? Authorizing Provider  albuterol (VENTOLIN HFA) 108 (90 Base) MCG/ACT inhaler Inhale 2 puffs into the lungs every 4 (four) hours as needed for wheezing or shortness of breath. 09/11/20  Yes Irean Hong, MD  azithromycin (ZITHROMAX) 250 MG tablet Take 1 tablet (250 mg total) by mouth daily. 09/11/20  Yes Irean Hong, MD  benzonatate (TESSALON) 200 MG capsule Take 1 capsule (200 mg total) by mouth 3 (three) times daily as needed for cough. 09/11/20  Yes Irean Hong, MD  predniSONE (DELTASONE) 20 MG tablet 3 tablets daily x 4 days 09/11/20  Yes Irean Hong, MD  dicyclomine (BENTYL) 10 MG capsule Take 1 capsule (10 mg total) by mouth 3 (three) times daily as needed for up to 14 days (abdominal pain/cramping). 07/27/20 08/10/20  Phineas Semen, MD  dicyclomine (BENTYL) 20 MG tablet Take 1 tablet (20 mg total) by mouth every 6 (six) hours as needed. Patient not  taking: Reported on 06/20/2020 09/27/18   Irean Hong, MD  ibuprofen (ADVIL) 800 MG tablet Take 1 tablet (800 mg total) by mouth every 8 (eight) hours as needed for moderate pain. 06/09/20   Irean Hong, MD  Norethindrone-Ethinyl Estradiol-Fe Biphas (LO LOESTRIN FE) 1 MG-10 MCG / 10 MCG tablet Take 1 tablet by mouth daily. 06/20/20 09/12/20  Mirna Mires, CNM  ondansetron (ZOFRAN ODT) 4 MG disintegrating tablet Take 1 tablet (4 mg total) by mouth every 8 (eight) hours as needed for nausea or vomiting. 06/09/20   Irean Hong, MD  ondansetron (ZOFRAN) 4 MG tablet Take 1 tablet (4 mg total) by mouth every 8 (eight) hours as needed. 07/27/20   Phineas Semen, MD  oxyCODONE-acetaminophen (PERCOCET/ROXICET) 5-325 MG tablet Take 1 tablet by mouth every 4 (four) hours as needed for severe pain. 06/09/20   Irean Hong, MD    Allergies Acetaminophen-codeine  Family History  Problem Relation Age of Onset  . Cancer Maternal Uncle 32       thyroid    Social History Social History   Tobacco Use  . Smoking status: Never Smoker  . Smokeless tobacco: Never Used  Vaping Use  . Vaping Use: Some days  . Substances: CBD  Substance Use Topics  . Alcohol use: Yes    Comment: socially  . Drug use: Never    Review of Systems  Constitutional: No fever/chills Eyes: No visual changes.  ENT: No sore throat. Cardiovascular: Denies chest pain. Respiratory: Positive for dry cough and wheezing.  Denies shortness of breath. Gastrointestinal: No abdominal pain.  No nausea, no vomiting.  No diarrhea.  No constipation. Genitourinary: Negative for dysuria. Musculoskeletal: Negative for back pain. Skin: Negative for rash. Neurological: Negative for headaches, focal weakness or numbness.   ____________________________________________   PHYSICAL EXAM:  VITAL SIGNS: ED Triage Vitals [09/11/20 0108]  Enc Vitals Group     BP (!) 145/96     Pulse Rate 84     Resp 20     Temp 98.3 F (36.8 C)      Temp Source Oral     SpO2 98 %     Weight      Height      Head Circumference      Peak Flow      Pain Score      Pain Loc      Pain Edu?      Excl. in GC?     Constitutional: Alert and oriented. Well appearing and in no acute distress. Eyes: Conjunctivae are normal. PERRL. EOMI. Head: Atraumatic. Nose: No congestion/rhinnorhea. Mouth/Throat: Mucous membranes are moist.   Neck: No stridor.   Cardiovascular: Normal rate, regular rhythm. Grossly normal heart sounds.  Good peripheral circulation. Respiratory: Dry, bronchitic cough noted.  Normal respiratory effort.  No retractions. Lungs CTAB.  No wheezing. Gastrointestinal: Soft and nontender. No distention. No abdominal bruits. No CVA tenderness. Musculoskeletal: No lower extremity tenderness nor edema.  No joint effusions. Neurologic:  Normal speech and language. No gross focal neurologic deficits are appreciated. No gait instability. Skin:  Skin is warm, dry and intact. No rash noted. Psychiatric: Mood and affect are normal. Speech and behavior are normal.  ____________________________________________   LABS (all labs ordered are listed, but only abnormal results are displayed)  Labs Reviewed  POC URINE PREG, ED   ____________________________________________  EKG  None ____________________________________________  RADIOLOGY I, Salah Burlison J, personally viewed and evaluated these images (plain radiographs) as part of my medical decision making, as well as reviewing the written report by the radiologist.  ED MD interpretation: No pneumonia  Official radiology report(s): DG Chest 2 View  Result Date: 09/11/2020 CLINICAL DATA:  Wheezing, cough EXAM: CHEST - 2 VIEW COMPARISON:  None. FINDINGS: The heart size and mediastinal contours are within normal limits. Both lungs are clear. The visualized skeletal structures are unremarkable. IMPRESSION: Normal study. Electronically Signed   By: Charlett Nose M.D.   On: 09/11/2020 01:27     ____________________________________________   PROCEDURES  Procedure(s) performed (including Critical Care):  Procedures   ____________________________________________   INITIAL IMPRESSION / ASSESSMENT AND PLAN / ED COURSE  As part of my medical decision making, I reviewed the following data within the electronic MEDICAL RECORD NUMBER Nursing notes reviewed and incorporated, Labs reviewed, Radiograph reviewed and Notes from prior ED visits     25 year old female presenting with bronchitic cough proximately 1 month after contracting COVID-19. Differential includes, but is not limited to, viral syndrome, bronchitis including COPD exacerbation, pneumonia, reactive airway disease including asthma, CHF including exacerbation with or without pulmonary/interstitial edema, pneumothorax, ACS, thoracic trauma, and pulmonary embolism.  We will treat symptomatically with prednisone, azithromycin, Tessalon and albuterol inhaler.  Strict return precautions given.  Patient verbalizes understanding and agrees with plan of care.      ____________________________________________   FINAL CLINICAL IMPRESSION(S) / ED DIAGNOSES  Final diagnoses:  Cough  Bronchitis     ED  Discharge Orders         Ordered    albuterol (VENTOLIN HFA) 108 (90 Base) MCG/ACT inhaler  Every 4 hours PRN        09/11/20 0138    predniSONE (DELTASONE) 20 MG tablet        09/11/20 0138    azithromycin (ZITHROMAX) 250 MG tablet  Daily        09/11/20 0138    benzonatate (TESSALON) 200 MG capsule  3 times daily PRN        09/11/20 0138          *Please note:  Evanee Lubrano was evaluated in Emergency Department on 09/11/2020 for the symptoms described in the history of present illness. She was evaluated in the context of the global COVID-19 pandemic, which necessitated consideration that the patient might be at risk for infection with the SARS-CoV-2 virus that causes COVID-19. Institutional protocols and algorithms  that pertain to the evaluation of patients at risk for COVID-19 are in a state of rapid change based on information released by regulatory bodies including the CDC and federal and state organizations. These policies and algorithms were followed during the patient's care in the ED.  Some ED evaluations and interventions may be delayed as a result of limited staffing during and the pandemic.*   Note:  This document was prepared using Dragon voice recognition software and may include unintentional dictation errors.   Irean Hong, MD 09/11/20 [phone]

## 2020-09-11 NOTE — ED Provider Notes (Incomplete)
[facility] Emergency Department Provider Note   ____________________________________________   Event Date/Time   First MD Initiated Contact with Patient 09/11/20 0132     (approximate)  I have reviewed the triage vital signs and the nursing notes.   HISTORY  Chief Complaint Cough    HPI Jaime Hernandez is a 25 y.o. female ***        {**SYMPTOM/COMPLAINT  LOCATION (describe anatomically) DURATION (when did it start) TIMING (onset and pattern) SEVERITY (0-10, mild/moderate/severe) QUALITY (description of symptoms) CONTEXT (recent surgery, new meds, activity, etc.) MODIFYINGFACTORS (what makes it better/worse) ASSOCIATEDSYMPTOMS (pertinent positives and negatives)**} Past Medical History:  Diagnosis Date  . Obesity   . PCOS (polycystic ovarian syndrome)   . PCOS (polycystic ovarian syndrome)     There are no problems to display for this patient.   Past Surgical History:  Procedure Laterality Date  . WISDOM TOOTH EXTRACTION      Prior to Admission medications   Medication Sig Start Date End Date Taking? Authorizing Provider  dicyclomine (BENTYL) 10 MG capsule Take 1 capsule (10 mg total) by mouth 3 (three) times daily as needed for up to 14 days (abdominal pain/cramping). 07/27/20 08/10/20  Phineas Semen, MD  dicyclomine (BENTYL) 20 MG tablet Take 1 tablet (20 mg total) by mouth every 6 (six) hours as needed. Patient not taking: Reported on 06/20/2020 09/27/18   Irean Hong, MD  ibuprofen (ADVIL) 800 MG tablet Take 1 tablet (800 mg total) by mouth every 8 (eight) hours as needed for moderate pain. 06/09/20   Irean Hong, MD  Norethindrone-Ethinyl Estradiol-Fe Biphas (LO LOESTRIN FE) 1 MG-10 MCG / 10 MCG tablet Take 1 tablet by mouth daily. 06/20/20 09/12/20  Mirna Mires, CNM  ondansetron (ZOFRAN ODT) 4 MG disintegrating tablet Take 1 tablet (4 mg total) by mouth every 8 (eight) hours as needed for nausea or vomiting. 06/09/20   Irean Hong, MD  ondansetron (ZOFRAN) 4 MG tablet Take 1 tablet (4 mg total) by mouth every 8 (eight) hours as needed. 07/27/20   Phineas Semen, MD  oxyCODONE-acetaminophen (PERCOCET/ROXICET) 5-325 MG tablet Take 1 tablet by mouth every 4 (four) hours as needed for severe pain. 06/09/20   Irean Hong, MD    Allergies Acetaminophen-codeine  Family History  Problem Relation Age of Onset  . Cancer Maternal Uncle 68       thyroid    Social History Social History   Tobacco Use  . Smoking status: Never Smoker  . Smokeless tobacco: Never Used  Vaping Use  . Vaping Use: Some days  . Substances: CBD  Substance Use Topics  . Alcohol use: Yes    Comment: socially  . Drug use: Never    Review of Systems {** Revise as appropriate then delete this line - Documentation of 10 systems is required  **} Constitutional: No fever/chills Eyes: No visual changes. ENT: No sore throat. Cardiovascular: Denies chest pain. Respiratory: Denies shortness of breath. Gastrointestinal: No abdominal pain.  No nausea, no vomiting.  No diarrhea.  No constipation. Genitourinary: Negative for dysuria. Musculoskeletal: Negative for back pain. Skin: Negative for rash. Neurological: Negative for headaches, focal weakness or numbness. {**Psychiatric:  Endocrine:  Hematological/Lymphatic:  Allergic/Immunilogical: **}  ____________________________________________   PHYSICAL EXAM:  VITAL SIGNS: ED Triage Vitals [09/11/20 0108]  Enc Vitals Group     BP (!) 145/96     Pulse Rate 84     Resp 20     Temp 98.3  F (36.8 C)     Temp Source Oral     SpO2 98 %     Weight      Height      Head Circumference      Peak Flow      Pain Score      Pain Loc      Pain Edu?      Excl. in GC?    {** Revise as appropriate then delete this line - 8 systems required **} Constitutional: Alert and oriented. Well appearing and in no acute distress. Eyes: Conjunctivae are normal. PERRL. EOMI. Head:  Atraumatic. Nose: No congestion/rhinnorhea. Mouth/Throat: Mucous membranes are moist.  Oropharynx non-erythematous. Neck: No stridor.  {**No cervical spine tenderness to palpation.**} {**Hematological/Lymphatic/Immunilogical: No cervical lymphadenopathy. **}Cardiovascular: Normal rate, regular rhythm. Grossly normal heart sounds.  Good peripheral circulation. Respiratory: Normal respiratory effort.  No retractions. Lungs CTAB. Gastrointestinal: Soft and nontender. No distention. No abdominal bruits. No CVA tenderness. {**Genitourinary:  **}Musculoskeletal: No lower extremity tenderness nor edema.  No joint effusions. Neurologic:  Normal speech and language. No gross focal neurologic deficits are appreciated. No gait instability. Skin:  Skin is warm, dry and intact. No rash noted. Psychiatric: Mood and affect are normal. Speech and behavior are normal.  ____________________________________________   LABS (all labs ordered are listed, but only abnormal results are displayed)  Labs Reviewed  POC URINE PREG, ED   ____________________________________________  EKG  *** ____________________________________________  RADIOLOGY I, SUNG,JADE J, personally viewed and evaluated these images (plain radiographs) as part of my medical decision making, as well as reviewing the written report by the radiologist.  ED MD interpretation:  ***  Official radiology report(s): DG Chest 2 View  Result Date: 09/11/2020 CLINICAL DATA:  Wheezing, cough EXAM: CHEST - 2 VIEW COMPARISON:  None. FINDINGS: The heart size and mediastinal contours are within normal limits. Both lungs are clear. The visualized skeletal structures are unremarkable. IMPRESSION: Normal study. Electronically Signed   By: Charlett Nose M.D.   On: 09/11/2020 01:27    ____________________________________________   PROCEDURES  Procedure(s) performed (including Critical  Care):  Procedures   ____________________________________________   INITIAL IMPRESSION / ASSESSMENT AND PLAN / ED COURSE  As part of my medical decision making, I reviewed the following data within the electronic MEDICAL RECORD NUMBER {[id number]::"Notes from prior ED visits","[place] Controlled Substance Database"}        ***      ____________________________________________   FINAL CLINICAL IMPRESSION(S) / ED DIAGNOSES  Final diagnoses:  None     ED Discharge Orders    None      *Please note:  Jaime Hernandez was evaluated in Emergency Department on 09/11/2020 for the symptoms described in the history of present illness. She was evaluated in the context of the global COVID-19 pandemic, which necessitated consideration that the patient might be at risk for infection with the SARS-CoV-2 virus that causes COVID-19. Institutional protocols and algorithms that pertain to the evaluation of patients at risk for COVID-19 are in a state of rapid change based on information released by regulatory bodies including the CDC and federal and state organizations. These policies and algorithms were followed during the patient's care in the ED.  Some ED evaluations and interventions may be delayed as a result of limited staffing during and the pandemic.*   Note:  This document was prepared using Dragon voice recognition software and may include unintentional dictation errors.

## 2020-09-11 NOTE — Discharge Instructions (Addendum)
1.  Take antibiotic and steroid as prescribed daily for the next 4 days: Azithromycin 250mg  Prednisone 60mg  2. Take Tessalon as needed for cough. 3. Use Albuterol inhaler 2 puffs every 4 hours as needed for difficulty breathing. 4. Return to the ER for worsening symptoms, persistent vomiting, difficulty breathing or other concerns.

## 2020-10-22 ENCOUNTER — Other Ambulatory Visit: Payer: Self-pay

## 2020-10-22 ENCOUNTER — Emergency Department
Admission: EM | Admit: 2020-10-22 | Discharge: 2020-10-22 | Disposition: A | Discharge status: Home / Self Care | Payer: 59 | Attending: Emergency Medicine | Admitting: Emergency Medicine

## 2020-10-22 DIAGNOSIS — R000 Tachycardia, unspecified: Secondary | ICD-10-CM | POA: Diagnosis not present

## 2020-10-22 DIAGNOSIS — R42 Dizziness and giddiness: Secondary | ICD-10-CM | POA: Diagnosis not present

## 2020-10-22 DIAGNOSIS — F419 Anxiety disorder, unspecified: Secondary | ICD-10-CM | POA: Insufficient documentation

## 2020-10-22 DIAGNOSIS — T40715A Adverse effect of cannabis, initial encounter: Secondary | ICD-10-CM | POA: Insufficient documentation

## 2020-10-22 DIAGNOSIS — T887XXA Unspecified adverse effect of drug or medicament, initial encounter: Secondary | ICD-10-CM

## 2020-10-22 DIAGNOSIS — R519 Headache, unspecified: Secondary | ICD-10-CM | POA: Insufficient documentation

## 2020-10-22 LAB — POC URINE PREG, ED: Preg Test, Ur: NEGATIVE

## 2020-10-22 LAB — URINE DRUG SCREEN, QUALITATIVE (ARMC ONLY)
Amphetamines, Ur Screen: NOT DETECTED
Barbiturates, Ur Screen: NOT DETECTED
Benzodiazepine, Ur Scrn: NOT DETECTED
Cannabinoid 50 Ng, Ur ~~LOC~~: POSITIVE — AB
Cocaine Metabolite,Ur ~~LOC~~: NOT DETECTED
MDMA (Ecstasy)Ur Screen: NOT DETECTED
Methadone Scn, Ur: NOT DETECTED
Opiate, Ur Screen: NOT DETECTED
Phencyclidine (PCP) Ur S: NOT DETECTED
Tricyclic, Ur Screen: NOT DETECTED

## 2020-10-22 NOTE — ED Notes (Signed)
Patient declined discharge vital signs, states she was having a panic attack in triage.

## 2020-10-22 NOTE — ED Triage Notes (Signed)
Pt states coming in after smoking CBD and "I started to have the normal effects, lightheaded hard to breath." Pt states she just wanted to make sure she did not pass out while driving so she called 321. Pt states just feeling dizzy and tired right now.

## 2020-10-22 NOTE — ED Provider Notes (Signed)
[facility]-EMERGENCY DEPARTMENT  ____________________________________________  Time seen: Approximately 6:25 PM  I have reviewed the triage vital signs and the nursing notes.   HISTORY  Chief Complaint Medication Reaction (CBD)   Historian Patient    HPI Jaime Hernandez is a 25 y.o. female presents to the emergency department after patient states she smoked CBD.  She states that she developed headache and lightheadedness and generalized anxiety.  She states that her boyfriend experienced syncope and she became concerned.  She denies current chest pain, chest tightness or shortness of breath.  She states that pregnancy is a possibility for her.  She had had some nausea which is since resolved.  No abdominal pain.   Past Medical History:  Diagnosis Date  . Obesity   . PCOS (polycystic ovarian syndrome)   . PCOS (polycystic ovarian syndrome)      Immunizations up to date:  Yes.     Past Medical History:  Diagnosis Date  . Obesity   . PCOS (polycystic ovarian syndrome)   . PCOS (polycystic ovarian syndrome)     There are no problems to display for this patient.   Past Surgical History:  Procedure Laterality Date  . WISDOM TOOTH EXTRACTION      Prior to Admission medications   Medication Sig Start Date End Date Taking? Authorizing Provider  albuterol (VENTOLIN HFA) 108 (90 Base) MCG/ACT inhaler Inhale 2 puffs into the lungs every 4 (four) hours as needed for wheezing or shortness of breath. 09/11/20   Irean Hong, MD  azithromycin (ZITHROMAX) 250 MG tablet Take 1 tablet (250 mg total) by mouth daily. 09/11/20   Irean Hong, MD  benzonatate (TESSALON) 200 MG capsule Take 1 capsule (200 mg total) by mouth 3 (three) times daily as needed for cough. 09/11/20   Irean Hong, MD  dicyclomine (BENTYL) 10 MG capsule Take 1 capsule (10 mg total) by mouth 3 (three) times daily as needed for up to 14 days (abdominal pain/cramping). 07/27/20 08/10/20  Phineas Semen, MD  dicyclomine  (BENTYL) 20 MG tablet Take 1 tablet (20 mg total) by mouth every 6 (six) hours as needed. Patient not taking: Reported on 06/20/2020 09/27/18   Irean Hong, MD  ibuprofen (ADVIL) 800 MG tablet Take 1 tablet (800 mg total) by mouth every 8 (eight) hours as needed for moderate pain. 06/09/20   Irean Hong, MD  Norethindrone-Ethinyl Estradiol-Fe Biphas (LO LOESTRIN FE) 1 MG-10 MCG / 10 MCG tablet Take 1 tablet by mouth daily. 06/20/20 09/12/20  Mirna Mires, CNM  ondansetron (ZOFRAN ODT) 4 MG disintegrating tablet Take 1 tablet (4 mg total) by mouth every 8 (eight) hours as needed for nausea or vomiting. 06/09/20   Irean Hong, MD  ondansetron (ZOFRAN) 4 MG tablet Take 1 tablet (4 mg total) by mouth every 8 (eight) hours as needed. 07/27/20   Phineas Semen, MD  oxyCODONE-acetaminophen (PERCOCET/ROXICET) 5-325 MG tablet Take 1 tablet by mouth every 4 (four) hours as needed for severe pain. 06/09/20   Irean Hong, MD  predniSONE (DELTASONE) 20 MG tablet 3 tablets daily x 4 days 09/11/20   Irean Hong, MD    Allergies Acetaminophen-codeine  Family History  Problem Relation Age of Onset  . Cancer Maternal Uncle 30       thyroid    Social History Social History   Tobacco Use  . Smoking status: Never Smoker  . Smokeless tobacco: Never Used  Vaping Use  . Vaping Use: Some days  .  Substances: CBD  Substance Use Topics  . Alcohol use: Yes    Comment: socially  . Drug use: Never     Review of Systems  Constitutional: No fever/chills Eyes:  No discharge ENT: No upper respiratory complaints. Respiratory: no cough. No SOB/ use of accessory muscles to breath Gastrointestinal:   No nausea, no vomiting.  No diarrhea.  No constipation. Musculoskeletal: Negative for musculoskeletal pain. Skin: Negative for rash, abrasions, lacerations, ecchymosis.    ____________________________________________   PHYSICAL EXAM:  VITAL SIGNS: ED Triage Vitals  Enc Vitals Group     BP 10/22/20  1639 (!) 130/111     Pulse Rate 10/22/20 1639 (!) 106     Resp 10/22/20 1639 20     Temp 10/22/20 1639 98.9 F (37.2 C)     Temp src --      SpO2 10/22/20 1639 96 %     Weight 10/22/20 1640 240 lb (108.9 kg)     Height 10/22/20 1640 5\' 5"  (1.651 m)     Head Circumference --      Peak Flow --      Pain Score 10/22/20 1640 0     Pain Loc --      Pain Edu? --      Excl. in GC? --      Constitutional: Alert and oriented. Well appearing and in no acute distress. Eyes: Conjunctivae are normal. PERRL. EOMI. Head: Atraumatic. ENT:      Nose: No congestion/rhinnorhea.      Mouth/Throat: Mucous membranes are moist.  Neck: No stridor.  No cervical spine tenderness to palpation. Cardiovascular: Normal rate, regular rhythm. Normal S1 and S2.  Good peripheral circulation. Respiratory: Normal respiratory effort without tachypnea or retractions. Lungs CTAB. Good air entry to the bases with no decreased or absent breath sounds Gastrointestinal: Bowel sounds x 4 quadrants. Soft and nontender to palpation. No guarding or rigidity. No distention. Musculoskeletal: Full range of motion to all extremities. No obvious deformities noted Neurologic:  Normal for age. No gross focal neurologic deficits are appreciated.  Skin:  Skin is warm, dry and intact. No rash noted. Psychiatric: Mood and affect are normal for age. Speech and behavior are normal.   ____________________________________________   LABS (all labs ordered are listed, but only abnormal results are displayed)  Labs Reviewed  URINE DRUG SCREEN, QUALITATIVE ([facility] ONLY) - Abnormal; Notable for the following components:      Result Value   Cannabinoid 50 Ng, Ur [place] POSITIVE (*)    All other components within normal limits  POC URINE PREG, ED   ____________________________________________  EKG   ____________________________________________  RADIOLOGY   No results  found.  ____________________________________________    PROCEDURES  Procedure(s) performed:     Procedures     Medications - No data to display   ____________________________________________   INITIAL IMPRESSION / ASSESSMENT AND PLAN / ED COURSE  Pertinent labs & imaging results that were available during my care of the patient were reviewed by me and considered in my medical decision making (see chart for details).      Assessment and plan Drug reaction 25 year old female presents to the emergency department after she smokes CBD and became dizzy and lightheaded.  Patient was mildly tachycardic at triage but vital signs were otherwise reassuring.  She was alert, active and nontoxic-appearing on initial exam.  Urine pregnancy testing was negative.  EKG revealed normal sinus rhythm without ST segment elevation or apparent arrhythmia.  Patient was advised to discontinue  use of THC at home.  Return precautions were given to return with new or worsening symptoms.     ____________________________________________  FINAL CLINICAL IMPRESSION(S) / ED DIAGNOSES  Final diagnoses:  Drug side effects      NEW MEDICATIONS STARTED DURING THIS VISIT:  ED Discharge Orders    None          This chart was dictated using voice recognition software/Dragon. Despite best efforts to proofread, errors can occur which can change the meaning. Any change was purely unintentional.     Orvil Feil, PA-C 10/22/20 1826    Delton Prairie, MD 10/24/20 [phone]

## 2020-10-27 ENCOUNTER — Ambulatory Visit (INDEPENDENT_AMBULATORY_CARE_PROVIDER_SITE_OTHER): Payer: 59 | Admitting: Obstetrics and Gynecology

## 2020-10-27 ENCOUNTER — Other Ambulatory Visit: Payer: Self-pay

## 2020-10-27 ENCOUNTER — Encounter: Payer: Self-pay | Admitting: Obstetrics and Gynecology

## 2020-10-27 VITALS — BP 120/84 | HR 89 | Ht 65.0 in | Wt 251.5 lb

## 2020-10-27 DIAGNOSIS — E282 Polycystic ovarian syndrome: Secondary | ICD-10-CM

## 2020-10-27 DIAGNOSIS — N912 Amenorrhea, unspecified: Secondary | ICD-10-CM

## 2020-10-27 MED ORDER — NORETHIN ACE-ETH ESTRAD-FE 1-20 MG-MCG PO TABS: 1.0000 | ORAL_TABLET | Freq: Every day | ORAL | 2 refills | Status: DC

## 2020-10-27 MED ORDER — MEDROXYPROGESTERONE ACETATE 10 MG PO TABS: 10.0000 mg | ORAL_TABLET | Freq: Every day | ORAL | 0 refills | Status: DC

## 2020-10-27 NOTE — Progress Notes (Signed)
HPI:      Jaime Hernandez is a 25 y.o. No obstetric history on file. who LMP was No LMP recorded (lmp unknown).  Subjective:   She presents today to discuss amenorrhea and history of PCO.  She is interested in having a baby in the near future after "she has some weight loss".  She has not had a menstrual period since January.  Prior to that she had fairly regular periods occasionally skipping them but mostly every month.  She states that she has a history of PCO diagnosed at the age of 2.  She has taken birth control pills before for this.    Hx: The following portions of the patient's history were reviewed and updated as appropriate:             She  has a past medical history of Obesity, PCOS (polycystic ovarian syndrome), and PCOS (polycystic ovarian syndrome). She does not have a problem list on file. She  has a past surgical history that includes Wisdom tooth extraction. Her family history includes Cancer (age of onset: 12) in her maternal uncle. She  reports that she has never smoked. She has never used smokeless tobacco. She reports previous alcohol use. She reports current drug use. Drug: Marijuana. She has a current medication list which includes the following prescription(s): albuterol, medroxyprogesterone, norethindrone-ethinyl estradiol, benzonatate, dicyclomine, ibuprofen, ondansetron, ondansetron, oxycodone-acetaminophen, and prednisone. She is allergic to acetaminophen-codeine.       Review of Systems:  Review of Systems  Constitutional: Denied constitutional symptoms, night sweats, recent illness, fatigue, fever, insomnia and weight loss.  Eyes: Denied eye symptoms, eye pain, photophobia, vision change and visual disturbance.  Ears/Nose/Throat/Neck: Denied ear, nose, throat or neck symptoms, hearing loss, nasal discharge, sinus congestion and sore throat.  Cardiovascular: Denied cardiovascular symptoms, arrhythmia, chest pain/pressure, edema, exercise intolerance, orthopnea  and palpitations.  Respiratory: Denied pulmonary symptoms, asthma, pleuritic pain, productive sputum, cough, dyspnea and wheezing.  Gastrointestinal: Denied, gastro-esophageal reflux, melena, nausea and vomiting.  Genitourinary:  See HPI for additional information.  Musculoskeletal: Denied musculoskeletal symptoms, stiffness, swelling, muscle weakness and myalgia.  Dermatologic: Denied dermatology symptoms, rash and scar.  Neurologic: Denied neurology symptoms, dizziness, headache, neck pain and syncope.  Psychiatric: Denied psychiatric symptoms, anxiety and depression.  Endocrine: Denied endocrine symptoms including hot flashes and night sweats.   Meds:   Current Outpatient Medications on File Prior to Visit  Medication Sig Dispense Refill  . albuterol (VENTOLIN HFA) 108 (90 Base) MCG/ACT inhaler Inhale 2 puffs into the lungs every 4 (four) hours as needed for wheezing or shortness of breath. 18 g 0  . benzonatate (TESSALON) 200 MG capsule Take 1 capsule (200 mg total) by mouth 3 (three) times daily as needed for cough. (Patient not taking: Reported on 10/27/2020) 20 capsule 0  . dicyclomine (BENTYL) 20 MG tablet Take 1 tablet (20 mg total) by mouth every 6 (six) hours as needed. (Patient not taking: No sig reported) 20 tablet 0  . ibuprofen (ADVIL) 800 MG tablet Take 1 tablet (800 mg total) by mouth every 8 (eight) hours as needed for moderate pain. (Patient not taking: Reported on 10/27/2020) 15 tablet 0  . ondansetron (ZOFRAN ODT) 4 MG disintegrating tablet Take 1 tablet (4 mg total) by mouth every 8 (eight) hours as needed for nausea or vomiting. (Patient not taking: Reported on 10/27/2020) 20 tablet 0  . ondansetron (ZOFRAN) 4 MG tablet Take 1 tablet (4 mg total) by mouth every 8 (eight) hours as needed. (  Patient not taking: Reported on 10/27/2020) 20 tablet 0  . oxyCODONE-acetaminophen (PERCOCET/ROXICET) 5-325 MG tablet Take 1 tablet by mouth every 4 (four) hours as needed for severe pain.  (Patient not taking: Reported on 10/27/2020) 15 tablet 0  . predniSONE (DELTASONE) 20 MG tablet 3 tablets daily x 4 days (Patient not taking: Reported on 10/27/2020) 12 tablet 0   No current facility-administered medications on file prior to visit.       The pregnancy intention screening data noted above was reviewed. Potential methods of contraception were discussed. The patient elected to proceed with Oral Contraceptive.     Objective:     Vitals:   10/27/20 1330  BP: 120/84  Pulse: 89   Filed Weights   10/27/20 1330  Weight: 251 lb 8 oz (114.1 kg)                Assessment:    No obstetric history on file. There are no problems to display for this patient.    1. Amenorrhea   2. PCO (polycystic ovaries)     Patient most likely amenorrhea because of anovulatory PCO.  Patient desires fertility in the near future but not immediately.    Plan:            1.  We discussed PCO in detail.  I recommend retesting for PCO to find out if she has insulin resistance and exactly what type of PCO is involved.  Lab work ordered.  2.  Despite her elevated BMI we have decided to begin OCPs specifically for PCO.  Plan is to take these for a short term while she undergoes weight loss until she is ready to become pregnant.  3.  Weight loss discussed.  Calorie counting, exercise, change in lifestyle with 1 to 2 pound loss per week.   Orders Orders Placed This Encounter  Procedures  . DHEA-sulfate  . FSH/LH  . Glucose, fasting  . TSH  . Testosterone, Free, Total, SHBG  . Prolactin  . Insulin, random     Meds ordered this encounter  Medications  . norethindrone-ethinyl estradiol (JUNEL FE 1/20) 1-20 MG-MCG tablet    Sig: Take 1 tablet by mouth daily.    Dispense:  84 tablet    Refill:  2  . medroxyPROGESTERone (PROVERA) 10 MG tablet    Sig: Take 1 tablet (10 mg total) by mouth daily. Use for ten days    Dispense:  10 tablet    Refill:  0      F/U  No follow-ups on  file. I spent 33 minutes involved in the care of this patient preparing to see the patient by obtaining and reviewing her medical history (including labs, imaging tests and prior procedures), documenting clinical information in the electronic health record (EHR), counseling and coordinating care plans, writing and sending prescriptions, ordering tests or procedures and directly communicating with the patient by discussing pertinent items from her history and physical exam as well as detailing my assessment and plan as noted above so that she has an informed understanding.  All of her questions were answered.  Elonda Husky, M.D. 10/27/2020 2:03 PM

## 2020-10-28 ENCOUNTER — Other Ambulatory Visit: Payer: Self-pay

## 2020-10-28 ENCOUNTER — Other Ambulatory Visit: Payer: 59

## 2020-10-28 DIAGNOSIS — E282 Polycystic ovarian syndrome: Secondary | ICD-10-CM | POA: Diagnosis not present

## 2020-10-31 LAB — GLUCOSE, FASTING: Glucose, Plasma: 99 mg/dL (ref 65–99)

## 2020-10-31 LAB — TSH: TSH: 1.71 u[IU]/mL (ref 0.450–4.500)

## 2020-10-31 LAB — INSULIN, RANDOM: INSULIN: 65.1 u[IU]/mL — ABNORMAL HIGH (ref 2.6–24.9)

## 2020-10-31 LAB — TESTOSTERONE, FREE, TOTAL, SHBG
Sex Hormone Binding: 23.9 nmol/L — ABNORMAL LOW (ref 24.6–122.0)
Testosterone, Free: 3.5 pg/mL (ref 0.0–4.2)
Testosterone: 83 ng/dL — ABNORMAL HIGH (ref 13–71)

## 2020-10-31 LAB — FSH/LH
FSH: 4 m[IU]/mL
LH: 5.2 m[IU]/mL

## 2020-10-31 LAB — PROLACTIN: Prolactin: 12 ng/mL (ref 4.8–23.3)

## 2020-10-31 LAB — DHEA-SULFATE: DHEA-SO4: 196 ug/dL (ref 110.0–431.7)

## 2020-11-08 ENCOUNTER — Encounter: Payer: Self-pay | Admitting: Obstetrics and Gynecology

## 2020-11-08 ENCOUNTER — Other Ambulatory Visit: Payer: Self-pay

## 2020-11-08 ENCOUNTER — Ambulatory Visit (INDEPENDENT_AMBULATORY_CARE_PROVIDER_SITE_OTHER): Payer: 59 | Admitting: Obstetrics and Gynecology

## 2020-11-08 VITALS — BP 126/84 | HR 82 | Ht 65.0 in | Wt 251.3 lb

## 2020-11-08 DIAGNOSIS — E282 Polycystic ovarian syndrome: Secondary | ICD-10-CM | POA: Diagnosis not present

## 2020-11-08 DIAGNOSIS — E8881 Metabolic syndrome: Secondary | ICD-10-CM | POA: Diagnosis not present

## 2020-11-08 LAB — POCT URINE PREGNANCY: Preg Test, Ur: NEGATIVE

## 2020-11-08 MED ORDER — METFORMIN HCL 500 MG PO TABS: ORAL_TABLET | ORAL | 4 refills | Status: DC

## 2020-11-08 NOTE — Addendum Note (Signed)
Addended by: Dorian Pod on: 11/08/2020 10:25 AM   Modules accepted: Orders

## 2020-11-08 NOTE — Progress Notes (Signed)
HPI:      [name] is a 25 y.o. No obstetric history on file. who LMP was Patient's last menstrual period was 11/06/2020.  Subjective:   She presents today to discuss her results from PCO testing. She is interested in having a child in the near future.  She does plan weight loss prior to conception. She has been having irregular cycles.    Hx: The following portions of the patient's history were reviewed and updated as appropriate:             She  has a past medical history of Obesity, PCOS (polycystic ovarian syndrome), and PCOS (polycystic ovarian syndrome). She does not have a problem list on file. She  has a past surgical history that includes Wisdom tooth extraction. Her family history includes Cancer (age of onset: 47) in her maternal uncle. She  reports that she has never smoked. She has never used smokeless tobacco. She reports previous alcohol use. She reports current drug use. Drug: Marijuana. She has a current medication list which includes the following prescription(s): metformin, albuterol, dicyclomine, ibuprofen, medroxyprogesterone, norethindrone-ethinyl estradiol, ondansetron, ondansetron, oxycodone-acetaminophen, and prednisone. She is allergic to acetaminophen-codeine.       Review of Systems:  Review of Systems  Constitutional: Denied constitutional symptoms, night sweats, recent illness, fatigue, fever, insomnia and weight loss.  Eyes: Denied eye symptoms, eye pain, photophobia, vision change and visual disturbance.  Ears/Nose/Throat/Neck: Denied ear, nose, throat or neck symptoms, hearing loss, nasal discharge, sinus congestion and sore throat.  Cardiovascular: Denied cardiovascular symptoms, arrhythmia, chest pain/pressure, edema, exercise intolerance, orthopnea and palpitations.  Respiratory: Denied pulmonary symptoms, asthma, pleuritic pain, productive sputum, cough, dyspnea and wheezing.  Gastrointestinal: Denied, gastro-esophageal reflux, melena, nausea  and vomiting.  Genitourinary: Denied genitourinary symptoms including symptomatic vaginal discharge, pelvic relaxation issues, and urinary complaints.  Musculoskeletal: Denied musculoskeletal symptoms, stiffness, swelling, muscle weakness and myalgia.  Dermatologic: Denied dermatology symptoms, rash and scar.  Neurologic: Denied neurology symptoms, dizziness, headache, neck pain and syncope.  Psychiatric: Denied psychiatric symptoms, anxiety and depression.  Endocrine: Denied endocrine symptoms including hot flashes and night sweats.   Meds:   Current Outpatient Medications on File Prior to Visit  Medication Sig Dispense Refill  . albuterol (VENTOLIN HFA) 108 (90 Base) MCG/ACT inhaler Inhale 2 puffs into the lungs every 4 (four) hours as needed for wheezing or shortness of breath. (Patient not taking: Reported on 11/08/2020) 18 g 0  . dicyclomine (BENTYL) 20 MG tablet Take 1 tablet (20 mg total) by mouth every 6 (six) hours as needed. (Patient not taking: No sig reported) 20 tablet 0  . ibuprofen (ADVIL) 800 MG tablet Take 1 tablet (800 mg total) by mouth every 8 (eight) hours as needed for moderate pain. (Patient not taking: Reported on 10/27/2020) 15 tablet 0  . medroxyPROGESTERone (PROVERA) 10 MG tablet Take 1 tablet (10 mg total) by mouth daily. Use for ten days (Patient not taking: Reported on 11/08/2020) 10 tablet 0  . norethindrone-ethinyl estradiol (JUNEL FE 1/20) 1-20 MG-MCG tablet Take 1 tablet by mouth daily. (Patient not taking: Reported on 11/08/2020) 84 tablet 2  . ondansetron (ZOFRAN ODT) 4 MG disintegrating tablet Take 1 tablet (4 mg total) by mouth every 8 (eight) hours as needed for nausea or vomiting. (Patient not taking: No sig reported) 20 tablet 0  . ondansetron (ZOFRAN) 4 MG tablet Take 1 tablet (4 mg total) by mouth every 8 (eight) hours as needed. (Patient not taking: No sig reported) 20 tablet  0  . oxyCODONE-acetaminophen (PERCOCET/ROXICET) 5-325 MG tablet Take 1 tablet by mouth  every 4 (four) hours as needed for severe pain. (Patient not taking: No sig reported) 15 tablet 0  . predniSONE (DELTASONE) 20 MG tablet 3 tablets daily x 4 days (Patient not taking: No sig reported) 12 tablet 0   No current facility-administered medications on file prior to visit.       The pregnancy intention screening data noted above was reviewed. Potential methods of contraception were discussed. The patient elected to proceed with .     Objective:     Vitals:   11/08/20 0849  BP: 126/84  Pulse: 82   Filed Weights   11/08/20 0849  Weight: 251 lb 4.8 oz (114 kg)              See PCO lab work  Assessment:    No obstetric history on file. There are no problems to display for this patient.    1. PCO (polycystic ovaries)   2. Insulin resistance     Insulin resistance  Elevated testosterone   Both consistent with PCO as is her oligo ovulation.     Plan:            1.  As patient has been having unprotected intercourse pregnancy test was done today.  2.  Patient to begin OCPs as previously discussed.  This is expected to be a short course of OCPs as she loses weight and this will certainly help with her elevated testosterone levels.  3.  Insulin resistance discussed in detail.  Patient to begin Metformin as directed. Orders No orders of the defined types were placed in this encounter.    Meds ordered this encounter  Medications  . metFORMIN (GLUCOPHAGE) 500 MG tablet    Sig: Take one tablet by mouth daily for one week. Then increase to one tablet twice a day for one week.  Then two tablets twice a day.    Dispense:  120 tablet    Refill:  4      F/U  Return in about 3 months (around 02/07/2021). I spent 23 minutes involved in the care of this patient preparing to see the patient by obtaining and reviewing her medical history (including labs, imaging tests and prior procedures), documenting clinical information in the electronic health record (EHR), counseling  and coordinating care plans, writing and sending prescriptions, ordering tests or procedures and directly communicating with the patient by discussing pertinent items from her history and physical exam as well as detailing my assessment and plan as noted above so that she has an informed understanding.  All of her questions were answered.  Elonda Husky, M.D. 11/08/2020 9:44 AM

## 2021-01-21 ENCOUNTER — Emergency Department: Payer: 59

## 2021-01-21 ENCOUNTER — Other Ambulatory Visit: Payer: Self-pay

## 2021-01-21 DIAGNOSIS — R0789 Other chest pain: Secondary | ICD-10-CM | POA: Diagnosis not present

## 2021-01-21 DIAGNOSIS — R55 Syncope and collapse: Secondary | ICD-10-CM | POA: Diagnosis not present

## 2021-01-21 DIAGNOSIS — R42 Dizziness and giddiness: Secondary | ICD-10-CM | POA: Diagnosis not present

## 2021-01-21 DIAGNOSIS — J323 Chronic sphenoidal sinusitis: Secondary | ICD-10-CM | POA: Diagnosis not present

## 2021-01-21 DIAGNOSIS — R001 Bradycardia, unspecified: Secondary | ICD-10-CM | POA: Insufficient documentation

## 2021-01-21 DIAGNOSIS — S0990XA Unspecified injury of head, initial encounter: Secondary | ICD-10-CM | POA: Diagnosis not present

## 2021-01-21 NOTE — ED Notes (Signed)
Discussed with dr. Katrinka Blazing pt's presenting symptoms, order received for head ct.

## 2021-01-21 NOTE — ED Triage Notes (Signed)
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 Pt states she felt something "pop" in her head, she became dizzy and thought she was going to faint. Pt states senstaion lasted approx 5 minutes. Pt states has a slight headache currently.

## 2021-01-22 ENCOUNTER — Emergency Department
Admission: EM | Admit: 2021-01-22 | Discharge: 2021-01-22 | Disposition: A | Discharge status: Home / Self Care | Payer: 59 | Attending: Emergency Medicine | Admitting: Emergency Medicine

## 2021-01-22 ENCOUNTER — Other Ambulatory Visit: Payer: Self-pay

## 2021-01-22 DIAGNOSIS — R0789 Other chest pain: Secondary | ICD-10-CM | POA: Diagnosis not present

## 2021-01-22 DIAGNOSIS — R001 Bradycardia, unspecified: Secondary | ICD-10-CM | POA: Diagnosis not present

## 2021-01-22 DIAGNOSIS — R55 Syncope and collapse: Secondary | ICD-10-CM | POA: Diagnosis not present

## 2021-01-22 LAB — POC URINE PREG, ED: Preg Test, Ur: NEGATIVE

## 2021-01-22 NOTE — ED Provider Notes (Signed)
[facility] Emergency Department Provider Note  ____________________________________________   Event Date/Time   First MD Initiated Contact with Patient 01/22/21 0254     (approximate)  I have reviewed the triage vital signs and the nursing notes.   HISTORY  Chief Complaint Dizziness    HPI Jaime Hernandez is a 25 y.o. female with history of obesity, PCOS who works in [employer] who came down after a rapid response was called due to a near syncopal event.  States she was cleaning her room when she suddenly felt lightheaded, hot, had a metallic taste in her mouth and felt like she was going to pass out.  Did have some chest tightness that is improving.  No shortness of breath.  States symptoms have slowly improved over time.  She did feel a pop in her head and had headache but this is also resolved.  No numbness, tingling or weakness.  No history of PE, DVT, exogenous estrogen use, recent fractures, surgery, trauma, hospitalization, prolonged travel or other immobilization. No lower extremity swelling or pain. No calf tenderness.  No recent heavy menstrual cycles.  No bloody stools or melena.  No fevers, cough, vomiting or diarrhea.        Past Medical History:  Diagnosis Date   Obesity    PCOS (polycystic ovarian syndrome)    PCOS (polycystic ovarian syndrome)     There are no problems to display for this patient.   Past Surgical History:  Procedure Laterality Date   WISDOM TOOTH EXTRACTION      Prior to Admission medications   Medication Sig Start Date End Date Taking? Authorizing Provider  albuterol (VENTOLIN HFA) 108 (90 Base) MCG/ACT inhaler Inhale 2 puffs into the lungs every 4 (four) hours as needed for wheezing or shortness of breath. Patient not taking: Reported on 11/08/2020 09/11/20   Irean Hong, MD  dicyclomine (BENTYL) 20 MG tablet Take 1 tablet (20 mg total) by mouth every 6 (six) hours as needed. Patient not taking: No sig reported  09/27/18   Irean Hong, MD  ibuprofen (ADVIL) 800 MG tablet Take 1 tablet (800 mg total) by mouth every 8 (eight) hours as needed for moderate pain. Patient not taking: Reported on 10/27/2020 06/09/20   Irean Hong, MD  medroxyPROGESTERone (PROVERA) 10 MG tablet Take 1 tablet (10 mg total) by mouth daily. Use for ten days Patient not taking: Reported on 11/08/2020 10/27/20   Linzie Collin, MD  metFORMIN (GLUCOPHAGE) 500 MG tablet Take one tablet by mouth daily for one week. Then increase to one tablet twice a day for one week.  Then two tablets twice a day. 11/08/20   Linzie Collin, MD  norethindrone-ethinyl estradiol (JUNEL FE 1/20) 1-20 MG-MCG tablet Take 1 tablet by mouth daily. Patient not taking: Reported on 11/08/2020 10/27/20   Linzie Collin, MD  ondansetron (ZOFRAN ODT) 4 MG disintegrating tablet Take 1 tablet (4 mg total) by mouth every 8 (eight) hours as needed for nausea or vomiting. Patient not taking: No sig reported 06/09/20   Irean Hong, MD  ondansetron (ZOFRAN) 4 MG tablet Take 1 tablet (4 mg total) by mouth every 8 (eight) hours as needed. Patient not taking: No sig reported 07/27/20   Phineas Semen, MD  oxyCODONE-acetaminophen (PERCOCET/ROXICET) 5-325 MG tablet Take 1 tablet by mouth every 4 (four) hours as needed for severe pain. Patient not taking: No sig reported 06/09/20   Irean Hong, MD  predniSONE (DELTASONE)  20 MG tablet 3 tablets daily x 4 days Patient not taking: No sig reported 09/11/20   Irean Hong, MD    Allergies Acetaminophen-codeine  Family History  Problem Relation Age of Onset   Cancer Maternal Uncle 84       thyroid    Social History Social History   Tobacco Use   Smoking status: Never   Smokeless tobacco: Never  Vaping Use   Vaping Use: Some days   Substances: CBD  Substance Use Topics   Alcohol use: Not Currently    Comment: socially   Drug use: Yes    Types: Marijuana    Review of Systems Constitutional: No fever. Eyes: No  visual changes. ENT: No sore throat. Cardiovascular: Denies chest pain. Respiratory: Denies shortness of breath. Gastrointestinal: No nausea, vomiting, diarrhea. Genitourinary: Negative for dysuria. Musculoskeletal: Negative for back pain. Skin: Negative for rash. Neurological: Negative for focal weakness or numbness.  ____________________________________________   PHYSICAL EXAM:  VITAL SIGNS: ED Triage Vitals  Enc Vitals Group     BP 01/21/21 2242 127/70     Pulse Rate 01/21/21 2242 83     Resp 01/21/21 2242 16     Temp 01/21/21 2242 97.6 F (36.4 C)     Temp Source 01/21/21 2242 Oral     SpO2 01/21/21 2242 100 %     Weight 01/21/21 2243 220 lb (99.8 kg)     Height 01/21/21 2243 5\' 5"  (1.651 m)     Head Circumference --      Peak Flow --      Pain Score 01/21/21 2243 5     Pain Loc --      Pain Edu? --      Excl. in GC? --    CONSTITUTIONAL: Alert and oriented and responds appropriately to questions. Well-appearing; well-nourished HEAD: Normocephalic, atraumatic EYES: Conjunctivae clear, pupils appear equal, EOM appear intact ENT: normal nose; moist mucous membranes NECK: Supple, normal ROM CARD: RRR; S1 and S2 appreciated; no murmurs, no clicks, no rubs, no gallops RESP: Normal chest excursion without splinting or tachypnea; breath sounds clear and equal bilaterally; no wheezes, no rhonchi, no rales, no hypoxia or respiratory distress, speaking full sentences ABD/GI: Normal bowel sounds; non-distended; soft, non-tender, no rebound, no guarding, no peritoneal signs, no hepatosplenomegaly BACK: The back appears normal EXT: Normal ROM in all joints; no deformity noted, no edema; no cyanosis, no calf tenderness or calf swelling SKIN: Normal color for age and race; warm; no rash on exposed skin NEURO: Moves all extremities equally, normal sensation diffusely, cranial nerves II through XII intact, normal speech, normal gait PSYCH: The patient's mood and manner are  appropriate.  ____________________________________________   LABS (all labs ordered are listed, but only abnormal results are displayed)  Labs Reviewed  POC URINE PREG, ED   ____________________________________________  EKG   EKG Interpretation  Date/Time:  Sunday January 22 2021 03:05:25 EDT Ventricular Rate:  54 PR Interval:  146 QRS Duration: 88 QT Interval:  436 QTC Calculation: 413 R Axis:   53 Text Interpretation: Sinus bradycardia with sinus arrhythmia Otherwise normal ECG Confirmed by [id number] [phone]) on 01/22/2021 3:17:52 AM         ____________________________________________  RADIOLOGY 01/24/2021 Navie Lamoreaux, personally viewed and evaluated these images (plain radiographs) as part of my medical decision making, as well as reviewing the written report by the radiologist.  ED MD interpretation: CT head shows no acute abnormality.  Official radiology report(s): CT Head Wo Contrast  Result Date: 01/21/2021 CLINICAL DATA:  Patient felt something pop in her head, became dizzy, and thought she was going to faint. EXAM: CT HEAD WITHOUT CONTRAST TECHNIQUE: Contiguous axial images were obtained from the base of the skull through the vertex without intravenous contrast. COMPARISON:  None. FINDINGS: Brain: No evidence of acute infarction, hemorrhage, hydrocephalus, extra-axial collection or mass lesion/mass effect. Vascular: No hyperdense vessel or unexpected calcification. Skull: Calvarium appears intact. Sinuses/Orbits: Opacification of the right sphenoid sinus and some of the right ethmoid air cells. Mucosal thickening in the right maxillary antrum. No acute air-fluid levels. Mastoid air cells are clear. Other: None. IMPRESSION: 1. No acute intracranial abnormalities. 2. Inflammatory changes in the paranasal sinuses. Electronically Signed   By: Burman NievesWilliam  Stevens M.D.   On: 01/21/2021 23:18    ____________________________________________   PROCEDURES  Procedure(s) performed  (including Critical Care):  Procedures  ____________________________________________   INITIAL IMPRESSION / ASSESSMENT AND PLAN / ED COURSE  As part of my medical decision making, I reviewed the following data within the electronic MEDICAL RECORD NUMBER Nursing notes reviewed and incorporated, EKG interpreted , Old EKG reviewed, Old chart reviewed, Radiograph reviewed , and Notes from prior ED visits         Patient here after near syncopal event.  States she felt hot, lightheaded and had headache and chest tightness.  EKG shows no ischemia, worrisome arrhythmia.  No interval abnormalities.  Urine pregnancy negative.  CT head unremarkable.  No sign of intracranial hemorrhage.  CT obtained within 6-hour window after headache onset.  I do not feel she needs a lumbar puncture.  She reports feeling better.  Neurologically intact.  Doubt CVA.  No fever or meningismus.  Doubt meningitis.  She is PERC negative.  I have recommended routine blood work to evaluate for any signs of anemia, electrolyte derangement but she declines this stating that she wants to go home.  She states she does have a PCP for close outpatient follow-up.  Recommended rest, increase fluid intake and will provide with work note.  Discussed return precautions.  She is comfortable with this plan.  At this time, I do not feel there is any life-threatening condition present. I have reviewed, interpreted and discussed all results (EKG, imaging, lab, urine as appropriate) and exam findings with patient/family. I have reviewed nursing notes and appropriate previous records.  I feel the patient is safe to be discharged home without further emergent workup and can continue workup as an outpatient as needed. Discussed usual and customary return precautions. Patient/family verbalize understanding and are comfortable with this plan.  Outpatient follow-up has been provided as needed. All questions have been  answered.  ____________________________________________   FINAL CLINICAL IMPRESSION(S) / ED DIAGNOSES  Final diagnoses:  Near syncope     ED Discharge Orders     None       *Please note:  Jaime Hernandez was evaluated in Emergency Department on 01/22/2021 for the symptoms described in the history of present illness. She was evaluated in the context of the global COVID-19 pandemic, which necessitated consideration that the patient might be at risk for infection with the SARS-CoV-2 virus that causes COVID-19. Institutional protocols and algorithms that pertain to the evaluation of patients at risk for COVID-19 are in a state of rapid change based on information released by regulatory bodies including the CDC and federal and state organizations. These policies and algorithms were followed during the patient's care in the ED.  Some ED evaluations and interventions may be  delayed as a result of limited staffing during and the pandemic.*   Note:  This document was prepared using Dragon voice recognition software and may include unintentional dictation errors.    Pasqualina Colasurdo, Layla Maw, DO 01/22/21 0320

## 2021-01-22 NOTE — Discharge Instructions (Addendum)
Your head CT today was normal.  Your pregnancy test was negative.  Your EKG was reassuring.  I have recommended blood work be obtained today which you have declined.  Please follow-up closely with your primary care doctor.  I recommend increase fluid intake over the next several days.  Please return to the emergency department if you have any chest pain, shortness of breath, palpitations, lightheadedness, severe headache, numbness, tingling or focal weakness.

## 2021-02-08 ENCOUNTER — Other Ambulatory Visit: Payer: Self-pay

## 2021-02-08 ENCOUNTER — Ambulatory Visit (INDEPENDENT_AMBULATORY_CARE_PROVIDER_SITE_OTHER): Payer: 59 | Admitting: Obstetrics and Gynecology

## 2021-02-08 ENCOUNTER — Encounter: Payer: Self-pay | Admitting: Obstetrics and Gynecology

## 2021-02-08 VITALS — BP 115/81 | HR 83 | Ht 65.0 in | Wt 244.5 lb

## 2021-02-08 DIAGNOSIS — E8881 Metabolic syndrome: Secondary | ICD-10-CM

## 2021-02-08 DIAGNOSIS — E282 Polycystic ovarian syndrome: Secondary | ICD-10-CM | POA: Diagnosis not present

## 2021-02-08 NOTE — Progress Notes (Signed)
HPI:      [name] is a 25 y.o. No obstetric history on file. who LMP was Patient's last menstrual period was 01/14/2021.  Subjective:   She presents today to discuss her PCO and insulin resistance.  She and her partner have decided to delay childbearing until May next year.  She would like to remain on OCPs until that time.  She did start taking the metformin but it made her sick so she discontinued it.  She states that she is planning to try again with the metformin.     Hx: The following portions of the patient's history were reviewed and updated as appropriate:             She  has a past medical history of Obesity, PCOS (polycystic ovarian syndrome), and PCOS (polycystic ovarian syndrome). She does not have a problem list on file. She  has a past surgical history that includes Wisdom tooth extraction. Her family history includes Cancer (age of onset: 40) in her maternal uncle. She  reports that she has never smoked. She has never used smokeless tobacco. She reports previous alcohol use. She reports current drug use. Drug: Marijuana. She has a current medication list which includes the following prescription(s): albuterol, ibuprofen, metformin, norethindrone-ethinyl estradiol-fe, ondansetron, and ondansetron. She is allergic to acetaminophen-codeine.       Review of Systems:  Review of Systems  Constitutional: Denied constitutional symptoms, night sweats, recent illness, fatigue, fever, insomnia and weight loss.  Eyes: Denied eye symptoms, eye pain, photophobia, vision change and visual disturbance.  Ears/Nose/Throat/Neck: Denied ear, nose, throat or neck symptoms, hearing loss, nasal discharge, sinus congestion and sore throat.  Cardiovascular: Denied cardiovascular symptoms, arrhythmia, chest pain/pressure, edema, exercise intolerance, orthopnea and palpitations.  Respiratory: Denied pulmonary symptoms, asthma, pleuritic pain, productive sputum, cough, dyspnea and wheezing.   Gastrointestinal: Denied, gastro-esophageal reflux, melena, nausea and vomiting.  Genitourinary: Denied genitourinary symptoms including symptomatic vaginal discharge, pelvic relaxation issues, and urinary complaints.  Musculoskeletal: Denied musculoskeletal symptoms, stiffness, swelling, muscle weakness and myalgia.  Dermatologic: Denied dermatology symptoms, rash and scar.  Neurologic: Denied neurology symptoms, dizziness, headache, neck pain and syncope.  Psychiatric: Denied psychiatric symptoms, anxiety and depression.  Endocrine: Denied endocrine symptoms including hot flashes and night sweats.   Meds:   Current Outpatient Medications on File Prior to Visit  Medication Sig Dispense Refill   albuterol (VENTOLIN HFA) 108 (90 Base) MCG/ACT inhaler Inhale 2 puffs into the lungs every 4 (four) hours as needed for wheezing or shortness of breath. 18 g 0   ibuprofen (ADVIL) 800 MG tablet Take 1 tablet (800 mg total) by mouth every 8 (eight) hours as needed for moderate pain. 15 tablet 0   metFORMIN (GLUCOPHAGE) 500 MG tablet Take one tablet by mouth daily for one week. Then increase to one tablet twice a day for one week.  Then two tablets twice a day. (Patient not taking: Reported on 02/08/2021) 120 tablet 4   norethindrone-ethinyl estradiol (JUNEL FE 1/20) 1-20 MG-MCG tablet Take 1 tablet by mouth daily. (Patient not taking: No sig reported) 84 tablet 2   ondansetron (ZOFRAN ODT) 4 MG disintegrating tablet Take 1 tablet (4 mg total) by mouth every 8 (eight) hours as needed for nausea or vomiting. (Patient not taking: No sig reported) 20 tablet 0   ondansetron (ZOFRAN) 4 MG tablet Take 1 tablet (4 mg total) by mouth every 8 (eight) hours as needed. (Patient not taking: No sig reported) 20 tablet 0  No current facility-administered medications on file prior to visit.        The pregnancy intention screening data noted above was reviewed. Potential methods of contraception were discussed. The  patient elected to proceed with Oral Contraceptive.     Objective:     Vitals:   02/08/21 0846  BP: 115/81  Pulse: 83   Filed Weights   02/08/21 0846  Weight: 244 lb 8 oz (110.9 kg)                Assessment:    No obstetric history on file. There are no problems to display for this patient.    1. PCO (polycystic ovaries)   2. Insulin resistance     Patient currently on OCPs for PCO.  She would like to stay on these until May of next year when she and her partner plan to attempt pregnancy.   Plan:            1.  Continue OCPs  2.  Restart metformin  3.  Annual exam November Orders No orders of the defined types were placed in this encounter.   No orders of the defined types were placed in this encounter.     F/U  Return in about 5 months (around 07/11/2021) for Annual Physical. I spent 21 minutes involved in the care of this patient preparing to see the patient by obtaining and reviewing her medical history (including labs, imaging tests and prior procedures), documenting clinical information in the electronic health record (EHR), counseling and coordinating care plans, writing and sending prescriptions, ordering tests or procedures and directly communicating with the patient by discussing pertinent items from her history and physical exam as well as detailing my assessment and plan as noted above so that she has an informed understanding.  All of her questions were answered.  Elonda Husky, M.D. 02/08/2021 9:08 AM

## 2021-02-24 ENCOUNTER — Telehealth: Payer: Self-pay | Admitting: Obstetrics and Gynecology

## 2021-02-24 NOTE — Telephone Encounter (Signed)
Pt states that she is on the second week of her birth control.  She states that she is having some spotting that is brown and pink at times.  She is also having some cramping mostly on the left side sometimes both sides.

## 2021-02-28 NOTE — Telephone Encounter (Signed)
LM for patient to return call.

## 2021-02-28 NOTE — Telephone Encounter (Signed)
Spoke with patient and she had stopped taking the pills then started back. I let her know that this is normal when you restart the pills. Patient will call us back if this continues.

## 2021-04-07 ENCOUNTER — Other Ambulatory Visit: Payer: Self-pay

## 2021-04-07 DIAGNOSIS — E282 Polycystic ovarian syndrome: Secondary | ICD-10-CM

## 2021-04-07 MED ORDER — NORETHIN ACE-ETH ESTRAD-FE 1-20 MG-MCG PO TABS: 1.0000 | ORAL_TABLET | Freq: Every day | ORAL | 2 refills | Status: DC

## 2021-04-13 ENCOUNTER — Ambulatory Visit: Payer: Self-pay

## 2021-04-13 DIAGNOSIS — Z20822 Contact with and (suspected) exposure to covid-19: Secondary | ICD-10-CM | POA: Diagnosis not present

## 2021-04-13 DIAGNOSIS — R1013 Epigastric pain: Secondary | ICD-10-CM | POA: Diagnosis not present

## 2021-05-12 ENCOUNTER — Telehealth: Payer: 59 | Admitting: Family Medicine

## 2021-05-12 DIAGNOSIS — Z1152 Encounter for screening for COVID-19: Secondary | ICD-10-CM

## 2021-05-12 DIAGNOSIS — R051 Acute cough: Secondary | ICD-10-CM | POA: Diagnosis not present

## 2021-05-12 MED ORDER — BENZONATATE 100 MG PO CAPS
100.0000 mg | ORAL_CAPSULE | Freq: Two times a day (BID) | ORAL | 0 refills | Status: DC | PRN
Start: 2021-05-12 — End: 2021-07-11

## 2021-05-12 MED ORDER — PROMETHAZINE-DM 6.25-15 MG/5ML PO SYRP: 2.5000 mL | ORAL_SOLUTION | Freq: Four times a day (QID) | ORAL | 0 refills | Status: DC | PRN

## 2021-05-12 NOTE — Progress Notes (Signed)
Virtual Visit Consent   Jaime Hernandez, you are scheduled for a virtual visit with a [facility] provider today.     Just as with appointments in the office, your consent must be obtained to participate.  Your consent will be active for this visit and any virtual visit you may have with one of our providers in the next 365 days.     If you have a MyChart account, a copy of this consent can be sent to you electronically.  All virtual visits are billed to your insurance company just like a traditional visit in the office.    As this is a virtual visit, video technology does not allow for your provider to perform a traditional examination.  This may limit your provider's ability to fully assess your condition.  If your provider identifies any concerns that need to be evaluated in person or the need to arrange testing (such as labs, EKG, etc.), we will make arrangements to do so.     Although advances in technology are sophisticated, we cannot ensure that it will always work on either your end or our end.  If the connection with a video visit is poor, the visit may have to be switched to a telephone visit.  With either a video or telephone visit, we are not always able to ensure that we have a secure connection.     I need to obtain your verbal consent now.   Are you willing to proceed with your visit today?    Orel Hord has provided verbal consent on 05/12/2021 for a virtual visit (video or telephone).   Freddy Finner, NP   Date: 05/12/2021 8:33 AM   Virtual Visit via Video Note   I, Freddy Finner, connected with  Jaime Hernandez  ([id number], 1995/11/14) on 05/12/21 at  8:30 AM EDT by a video-enabled telemedicine application and verified that I am speaking with the correct person using two identifiers.  Location: Patient: Virtual Visit Location Patient: Home Provider: Virtual Visit Location Provider: Office/Clinic   I discussed the limitations of evaluation and management by telemedicine  and the availability of in person appointments. The patient expressed understanding and agreed to proceed.    History of Present Illness: Jaime Hernandez is a 25 y.o. who identifies as a female who was assigned female at birth, and is being seen today for a cold. Started 2-3 days ago with a sore throat. Over the last several days her throat pain is worse, developed cough, congestion, ear pressure and pain. Reports sweats and chills. Mild shortness of breath related to congestion. Unsure if she has a temp- has not checked it. Has not tested for covid. Will be contacting [facility] once visit is over to get covid screened and tested.   Denies chest pain or known sick contacts.  Problems: There are no problems to display for this patient.   Allergies:  Allergies  Allergen Reactions   Acetaminophen-Codeine Other (See Comments)   Medications:  Current Outpatient Medications:    albuterol (VENTOLIN HFA) 108 (90 Base) MCG/ACT inhaler, Inhale 2 puffs into the lungs every 4 (four) hours as needed for wheezing or shortness of breath., Disp: 18 g, Rfl: 0   ibuprofen (ADVIL) 800 MG tablet, Take 1 tablet (800 mg total) by mouth every 8 (eight) hours as needed for moderate pain., Disp: 15 tablet, Rfl: 0   metFORMIN (GLUCOPHAGE) 500 MG tablet, Take one tablet by mouth daily for one week. Then increase to one tablet twice  a day for one week.  Then two tablets twice a day. (Patient not taking: Reported on 02/08/2021), Disp: 120 tablet, Rfl: 4   norethindrone-ethinyl estradiol-FE (JUNEL FE 1/20) 1-20 MG-MCG tablet, Take 1 tablet by mouth daily., Disp: 84 tablet, Rfl: 2   ondansetron (ZOFRAN ODT) 4 MG disintegrating tablet, Take 1 tablet (4 mg total) by mouth every 8 (eight) hours as needed for nausea or vomiting. (Patient not taking: No sig reported), Disp: 20 tablet, Rfl: 0   ondansetron (ZOFRAN) 4 MG tablet, Take 1 tablet (4 mg total) by mouth every 8 (eight) hours as needed. (Patient not taking: No sig reported), Disp:  20 tablet, Rfl: 0  Observations/Objective: Patient is well-developed, well-nourished in no acute distress.  Resting comfortably  at home.  Head is normocephalic, atraumatic.  No labored breathing.  Speech is clear and coherent with logical content.  Patient is alert and oriented at baseline.    Assessment and Plan:  1. Encounter for screening for COVID-19 [facility] contacting for testing Advised of isolation until known Advised to message results for addition treatment if needed  2. Acute cough Main symptom of concern Will be getting tested for covid   Reviewed side effects, risks and benefits of medication.   Patient acknowledged agreement and understanding of the plan.    - promethazine-dextromethorphan (PROMETHAZINE-DM) 6.25-15 MG/5ML syrup; Take 2.5 mLs by mouth 4 (four) times daily as needed for cough.  Dispense: 30 mL; Refill: 0 - benzonatate (TESSALON) 100 MG capsule; Take 1 capsule (100 mg total) by mouth 2 (two) times daily as needed for cough.  Dispense: 20 capsule; Refill: 0  I discussed the assessment and treatment plan with the patient. The patient was provided an opportunity to ask questions and all were answered. The patient agreed with the plan and demonstrated an understanding of the instructions.   The patient was advised to call back or seek an in-person evaluation if the symptoms worsen or if the condition fails to improve as anticipated.   The above assessment and management plan was discussed with the patient. The patient verbalized understanding of and has agreed to the management plan. Patient is aware to call the clinic if symptoms persist or worsen. Patient is aware when to return to the clinic for a follow-up visit. Patient educated on when it is appropriate to go to the emergency department.   Follow Up Instructions: I discussed the assessment and treatment plan with the patient. The patient was provided an opportunity to ask questions and all were answered.  The patient agreed with the plan and demonstrated an understanding of the instructions.  A copy of instructions were sent to the patient via MyChart unless otherwise noted below.   The patient was advised to call back or seek an in-person evaluation if the symptoms worsen or if the condition fails to improve as anticipated.  Time:  I spent 10 minutes with the patient via telehealth technology discussing the above problems/concerns.    Freddy Finner, NP

## 2021-05-12 NOTE — Patient Instructions (Signed)
Please call [facility] and go get COVID tested  If you are COVID + I have included some info for you below.  I hope you feel better soon.  Please keep well-hydrated and get plenty of rest. Start a saline nasal rinse to flush out your nasal passages. You can use plain Mucinex to help thin congestion. If you have a humidifier, running in the bedroom at night. I want you to start OTC vitamin D3 1000 units daily, vitamin C 1000 mg daily, and a zinc supplement. Please take prescribed medications as directed.  You have been enrolled in a MyChart symptom monitoring program. Please answer these questions daily so we can keep track of how you are doing.  You were to quarantine for 5 days from onset of your symptoms.  After day 5, if you have had no fever and you are feeling better, you can end quarantine but need to mask for an additional 5 days. After day 5 if you have a fever or are having significant symptoms, please quarantine for full 10 days.  If you note any worsening of symptoms, any significant shortness of breath or any chest pain, please seek ER evaluation ASAP.  Please do not delay care!  COVID-19: What to Do if You Are Sick If you test positive and are an older adult or someone who is at high risk of getting very sick from COVID-19, treatment may be available. Contact a healthcare provider right away after a positive test to determine if you are eligible, even if your symptoms are mild right now. You can also visit a Test to Treat location and, if eligible, receive a prescription from a provider. Don't delay: Treatment must be started within the first few days to be effective. If you have a fever, cough, or other symptoms, you might have COVID-19. Most people have mild illness and are able to recover at home. If you are sick: Keep track of your symptoms. If you have an emergency warning sign (including trouble breathing), call 911. Steps to help prevent the spread of COVID-19 if you are  sick If you are sick with COVID-19 or think you might have COVID-19, follow the steps below to care for yourself and to help protect other people in your home and community. Stay home except to get medical care Stay home. Most people with COVID-19 have mild illness and can recover at home without medical care. Do not leave your home, except to get medical care. Do not visit public areas and do not go to places where you are unable to wear a mask. Take care of yourself. Get rest and stay hydrated. Take over-the-counter medicines, such as acetaminophen, to help you feel better. Stay in touch with your doctor. Call before you get medical care. Be sure to get care if you have trouble breathing, or have any other emergency warning signs, or if you think it is an emergency. Avoid public transportation, ride-sharing, or taxis if possible. Get tested If you have symptoms of COVID-19, get tested. While waiting for test results, stay away from others, including staying apart from those living in your household. Get tested as soon as possible after your symptoms start. Treatments may be available for people with COVID-19 who are at risk for becoming very sick. Don't delay: Treatment must be started early to be effective--some treatments must begin within 5 days of your first symptoms. Contact your healthcare provider right away if your test result is positive to determine if you are eligible.  Self-tests are one of several options for testing for the virus that causes COVID-19 and may be more convenient than laboratory-based tests and point-of-care tests. Ask your healthcare provider or your local health department if you need help interpreting your test results. You can visit your state, tribal, local, and territorial health department's website to look for the latest local information on testing sites. Separate yourself from other people As much as possible, stay in a specific room and away from other people and  pets in your home. If possible, you should use a separate bathroom. If you need to be around other people or animals in or outside of the home, wear a well-fitting mask. Tell your close contacts that they may have been exposed to COVID-19. An infected person can spread COVID-19 starting 48 hours (or 2 days) before the person has any symptoms or tests positive. By letting your close contacts know they may have been exposed to COVID-19, you are helping to protect everyone. See COVID-19 and Animals if you have questions about pets. If you are diagnosed with COVID-19, someone from the health department may call you. Answer the call to slow the spread. Monitor your symptoms Symptoms of COVID-19 include fever, cough, or other symptoms. Follow care instructions from your healthcare provider and local health department. Your local health authorities may give instructions on checking your symptoms and reporting information. When to seek emergency medical attention Look for emergency warning signs* for COVID-19. If someone is showing any of these signs, seek emergency medical care immediately: Trouble breathing Persistent pain or pressure in the chest New confusion Inability to wake or stay awake Pale, gray, or blue-colored skin, lips, or nail beds, depending on skin tone *This list is not all possible symptoms. Please call your medical provider for any other symptoms that are severe or concerning to you. Call 911 or call ahead to your local emergency facility: Notify the operator that you are seeking care for someone who has or may have COVID-19. Call ahead before visiting your doctor Call ahead. Many medical visits for routine care are being postponed or done by phone or telemedicine. If you have a medical appointment that cannot be postponed, call your doctor's office, and tell them you have or may have COVID-19. This will help the office protect themselves and other patients. If you are sick, wear a  well-fitting mask You should wear a mask if you must be around other people or animals, including pets (even at home). Wear a mask with the best fit, protection, and comfort for you. You don't need to wear the mask if you are alone. If you can't put on a mask (because of trouble breathing, for example), cover your coughs and sneezes in some other way. Try to stay at least 6 feet away from other people. This will help protect the people around you. Masks should not be placed on young children under age 74 years, anyone who has trouble breathing, or anyone who is not able to remove the mask without help. Cover your coughs and sneezes Cover your mouth and nose with a tissue when you cough or sneeze. Throw away used tissues in a lined trash can. Immediately wash your hands with soap and water for at least 20 seconds. If soap and water are not available, clean your hands with an alcohol-based hand sanitizer that contains at least 60% alcohol. Clean your hands often Wash your hands often with soap and water for at least 20 seconds. This is especially  important after blowing your nose, coughing, or sneezing; going to the bathroom; and before eating or preparing food. Use hand sanitizer if soap and water are not available. Use an alcohol-based hand sanitizer with at least 60% alcohol, covering all surfaces of your hands and rubbing them together until they feel dry. Soap and water are the best option, especially if hands are visibly dirty. Avoid touching your eyes, nose, and mouth with unwashed hands. Handwashing Tips Avoid sharing personal household items Do not share dishes, drinking glasses, cups, eating utensils, towels, or bedding with other people in your home. Wash these items thoroughly after using them with soap and water or put in the dishwasher. Clean surfaces in your home regularly Clean and disinfect high-touch surfaces (for example, doorknobs, tables, handles, light switches, and countertops)  in your "sick room" and bathroom. In shared spaces, you should clean and disinfect surfaces and items after each use by the person who is ill. If you are sick and cannot clean, a caregiver or other person should only clean and disinfect the area around you (such as your bedroom and bathroom) on an as needed basis. Your caregiver/other person should wait as long as possible (at least several hours) and wear a mask before entering, cleaning, and disinfecting shared spaces that you use. Clean and disinfect areas that may have blood, stool, or body fluids on them. Use household cleaners and disinfectants. Clean visible dirty surfaces with household cleaners containing soap or detergent. Then, use a household disinfectant. Use a product from [employer] List N: Disinfectants for Coronavirus (COVID-19). Be sure to follow the instructions on the label to ensure safe and effective use of the product. Many products recommend keeping the surface wet with a disinfectant for a certain period of time (look at "contact time" on the product label). You may also need to wear personal protective equipment, such as gloves, depending on the directions on the product label. Immediately after disinfecting, wash your hands with soap and water for 20 seconds. For completed guidance on cleaning and disinfecting your home, visit Complete Disinfection Guidance. Take steps to improve ventilation at home Improve ventilation (air flow) at home to help prevent from spreading COVID-19 to other people in your household. Clear out COVID-19 virus particles in the air by opening windows, using air filters, and turning on fans in your home. Use this interactive tool to learn how to improve air flow in your home. When you can be around others after being sick with COVID-19 Deciding when you can be around others is different for different situations. Find out when you can safely end home isolation. For any additional questions about your care,  contact your healthcare provider or state or local health department. 10/25/2020 Content source: [facility] Immun[facility] Res[facility] ([facility]), Division of Viral Diseases This information is not intended to replace advice given to you by your health care provider. Make sure you discuss any questions you have with your health care provider. Document Revised: 12/08/2020 Document Reviewed: 12/08/2020 Elsevier Patient Education  2022 [employer].

## 2021-05-17 ENCOUNTER — Other Ambulatory Visit: Payer: Self-pay

## 2021-05-17 ENCOUNTER — Emergency Department: Payer: 59

## 2021-05-17 ENCOUNTER — Emergency Department
Admission: EM | Admit: 2021-05-17 | Discharge: 2021-05-18 | Disposition: A | Discharge status: Home / Self Care | Payer: 59 | Attending: Emergency Medicine | Admitting: Emergency Medicine

## 2021-05-17 DIAGNOSIS — R059 Cough, unspecified: Secondary | ICD-10-CM | POA: Diagnosis not present

## 2021-05-17 DIAGNOSIS — Z20822 Contact with and (suspected) exposure to covid-19: Secondary | ICD-10-CM | POA: Insufficient documentation

## 2021-05-17 DIAGNOSIS — R519 Headache, unspecified: Secondary | ICD-10-CM | POA: Diagnosis not present

## 2021-05-17 DIAGNOSIS — J209 Acute bronchitis, unspecified: Secondary | ICD-10-CM | POA: Diagnosis not present

## 2021-05-17 LAB — RESP PANEL BY RT-PCR (FLU A&B, COVID) ARPGX2
Influenza A by PCR: NEGATIVE
Influenza B by PCR: NEGATIVE
SARS Coronavirus 2 by RT PCR: NEGATIVE

## 2021-05-17 MED ORDER — IPRATROPIUM-ALBUTEROL 0.5-2.5 (3) MG/3ML IN SOLN
3.0000 mL | Freq: Once | RESPIRATORY_TRACT | Status: AC
  Administered 2021-05-17: 3 mL via RESPIRATORY_TRACT
  Filled 2021-05-17: qty 3

## 2021-05-17 MED ORDER — PREDNISONE 20 MG PO TABS: 40.0000 mg | ORAL_TABLET | Freq: Every day | ORAL | 0 refills | Status: AC

## 2021-05-17 MED ORDER — ALBUTEROL SULFATE HFA 108 (90 BASE) MCG/ACT IN AERS: 2.0000 | INHALATION_SPRAY | RESPIRATORY_TRACT | 0 refills | Status: DC | PRN

## 2021-05-17 MED ORDER — PREDNISONE 20 MG PO TABS
60.0000 mg | ORAL_TABLET | Freq: Once | ORAL | Status: AC
  Administered 2021-05-17: 60 mg via ORAL
  Filled 2021-05-17: qty 3

## 2021-05-17 NOTE — ED Triage Notes (Signed)
MSE Jenise PA in triage 

## 2021-05-17 NOTE — ED Triage Notes (Signed)
Pt reports cough and congestion for one week with intermittent headache. Took z pac last week.  Has not tested for covid Cough noted in triage

## 2021-05-17 NOTE — ED Provider Notes (Signed)
Emergency Medicine Provider Triage Evaluation Note  Jaime Hernandez, a 25 y.o. female  was evaluated in triage.  Pt complains of continued cough and congestion for the last week. She has just completed a Z-pack prescribed by her PCP. She notes subjective fevers. She lost her inhaler and has been without for the last few days.   Review of Systems  Positive: cough Negative: CP, SOB  Physical Exam  BP 123/90   Pulse (!) 103   Temp 98.3 F (36.8 C)   Resp 20   Ht 5\' 5"  (1.651 m)   Wt 113.4 kg   LMP 05/17/2021   SpO2 97%   BMI 41.60 kg/m  Gen:   Awake, no distress  NAD Resp:  Normal effort CTA MSK:   Moves extremities without difficulty  Other:  CVS: RRR  Medical Decision Making  Medically screening exam initiated at 7:13 PM.  Appropriate orders placed.  Stephenie Navejas was informed that the remainder of the evaluation will be completed by another provider, this initial triage assessment does not replace that evaluation, and the importance of remaining in the ED until their evaluation is complete.  Patient with ED evaluation of ongoing cough.   Imagene Sheller, PA-C 05/17/21 1915    07/17/21, MD 05/18/21 [phone]

## 2021-05-17 NOTE — ED Provider Notes (Signed)
[facility] Emergency Department Provider Note  ____________________________________________  Time seen: Approximately 8:46 PM  I have reviewed the triage vital signs and the nursing notes.   HISTORY  Chief Complaint Cough    HPI Jaime Hernandez is a 25 y.o. female with a history of obesity and PCOS who comes ED complaining of cough and shortness of breath for the past week.  Waxing waning, no aggravating or alleviating factors.  She went to urgent care and was given a course of azithromycin and Tessalon which has not resolved the symptoms.  Denies fever or chest pain.  Does have a history of recurrent bronchitis in the past.    Past Medical History:  Diagnosis Date   Obesity    PCOS (polycystic ovarian syndrome)    PCOS (polycystic ovarian syndrome)      There are no problems to display for this patient.    Past Surgical History:  Procedure Laterality Date   WISDOM TOOTH EXTRACTION       Prior to Admission medications   Medication Sig Start Date End Date Taking? Authorizing Provider  albuterol (PROVENTIL HFA) 108 (90 Base) MCG/ACT inhaler Inhale 2 puffs into the lungs every 4 (four) hours as needed for wheezing or shortness of breath. 05/17/21  Yes Sharman Cheek, MD  predniSONE (DELTASONE) 20 MG tablet Take 2 tablets (40 mg total) by mouth daily with breakfast for 4 days. 05/17/21 05/21/21 Yes Sharman Cheek, MD  benzonatate (TESSALON) 100 MG capsule Take 1 capsule (100 mg total) by mouth 2 (two) times daily as needed for cough. 05/12/21   Freddy Finner, NP  ibuprofen (ADVIL) 800 MG tablet Take 1 tablet (800 mg total) by mouth every 8 (eight) hours as needed for moderate pain. 06/09/20   Irean Hong, MD  metFORMIN (GLUCOPHAGE) 500 MG tablet Take one tablet by mouth daily for one week. Then increase to one tablet twice a day for one week.  Then two tablets twice a day. Patient not taking: Reported on 02/08/2021 11/08/20   Linzie Collin, MD   norethindrone-ethinyl estradiol-FE (JUNEL FE 1/20) 1-20 MG-MCG tablet Take 1 tablet by mouth daily. 04/07/21   Linzie Collin, MD  ondansetron (ZOFRAN ODT) 4 MG disintegrating tablet Take 1 tablet (4 mg total) by mouth every 8 (eight) hours as needed for nausea or vomiting. Patient not taking: No sig reported 06/09/20   Irean Hong, MD  ondansetron (ZOFRAN) 4 MG tablet Take 1 tablet (4 mg total) by mouth every 8 (eight) hours as needed. Patient not taking: No sig reported 07/27/20   Phineas Semen, MD  promethazine-dextromethorphan (PROMETHAZINE-DM) 6.25-15 MG/5ML syrup Take 2.5 mLs by mouth 4 (four) times daily as needed for cough. 05/12/21   Freddy Finner, NP     Allergies Acetaminophen-codeine   Family History  Problem Relation Age of Onset   Cancer Maternal Uncle 69       thyroid    Social History Social History   Tobacco Use   Smoking status: Never   Smokeless tobacco: Never  Vaping Use   Vaping Use: Some days   Substances: CBD  Substance Use Topics   Alcohol use: Not Currently    Comment: socially   Drug use: Yes    Types: Marijuana    Review of Systems  Constitutional:   No fever or chills.  ENT:   Positive sore throat.  Positive nasal congestion . Cardiovascular:   No chest pain or syncope. Respiratory:   Positive shortness  of breath and nonproductive cough. Gastrointestinal:   Negative for abdominal pain, vomiting and diarrhea.  Musculoskeletal:   Negative for focal pain or swelling All other systems reviewed and are negative except as documented above in ROS and HPI.  ____________________________________________   PHYSICAL EXAM:  VITAL SIGNS: ED Triage Vitals  Enc Vitals Group     BP 05/17/21 1841 123/90     Pulse Rate 05/17/21 1841 (!) 103     Resp 05/17/21 1841 20     Temp 05/17/21 1841 98.3 F (36.8 C)     Temp src --      SpO2 05/17/21 1841 97 %     Weight 05/17/21 1842 250 lb (113.4 kg)     Height 05/17/21 1842 5\' 5"  (1.651 m)      Head Circumference --      Peak Flow --      Pain Score 05/17/21 1841 4     Pain Loc --      Pain Edu? --      Excl. in GC? --     Vital signs reviewed, nursing assessments reviewed.   Constitutional:   Alert and oriented. Non-toxic appearance. Eyes:   Conjunctivae are normal. EOMI. PERRL. ENT      Head:   Normocephalic and atraumatic.      Nose:   Nasal congestion      Mouth/Throat:   Mild pharyngeal erythema.      Neck:   No meningismus. Full ROM. Hematological/Lymphatic/Immunilogical:   No cervical lymphadenopathy. Cardiovascular:   RRR, heart rate 80. Symmetric bilateral radial and DP pulses.  No murmurs. Cap refill less than 2 seconds. Respiratory:   Normal respiratory effort without tachypnea/retractions.  Normal air entry bilaterally.  There is expiratory wheezing with cough which is provoked and exacerbated by FEV1 maneuver. Gastrointestinal:   Soft and nontender. Non distended. There is no CVA tenderness.  No rebound, rigidity, or guarding. Genitourinary:   deferred Musculoskeletal:   Normal range of motion in all extremities. No joint effusions.  No lower extremity tenderness.  No edema. Neurologic:   Normal speech and language.  Motor grossly intact. No acute focal neurologic deficits are appreciated.  Skin:    Skin is warm, dry and intact. No rash noted.  No petechiae, purpura, or bullae.  ____________________________________________    LABS (pertinent positives/negatives) (all labs ordered are listed, but only abnormal results are displayed) Labs Reviewed  RESP PANEL BY RT-PCR (FLU A&B, COVID) [id number]   ____________________________________________   EKG    ____________________________________________    RADIOLOGY  DG Chest 2 View  Result Date: 05/17/2021 CLINICAL DATA:  Cough and congestion for 1 week.  Headache. EXAM: CHEST - 2 VIEW COMPARISON:  09/11/2020 FINDINGS: Heart size and pulmonary vascularity are normal. Lungs are clear. No pleural  effusions. No pneumothorax. Mediastinal contours appear intact. IMPRESSION: No active cardiopulmonary disease. Electronically Signed   By: 11/09/2020 M.D.   On: 05/17/2021 19:10    ____________________________________________   PROCEDURES Procedures  ____________________________________________    CLINICAL IMPRESSION / ASSESSMENT AND PLAN / ED COURSE  Medications ordered in the ED: Medications  predniSONE (DELTASONE) tablet 60 mg (has no administration in time range)  ipratropium-albuterol (DUONEB) 0.5-2.5 (3) MG/3ML nebulizer solution 3 mL (has no administration in time range)    Pertinent labs & imaging results that were available during my care of the patient were reviewed by me and considered in my medical decision making (see chart for details).  Jaime Hernandez was evaluated in  Emergency Department on 05/17/2021 for the symptoms described in the history of present illness. She was evaluated in the context of the global COVID-19 pandemic, which necessitated consideration that the patient might be at risk for infection with the SARS-CoV-2 virus that causes COVID-19. Institutional protocols and algorithms that pertain to the evaluation of patients at risk for COVID-19 are in a state of rapid change based on information released by regulatory bodies including the CDC and federal and state organizations. These policies and algorithms were followed during the patient's care in the ED.   Patient presents with cough shortness of breath, clinically apparent episode of acute bronchitis.   Considering the patient's symptoms, medical history, and physical examination today, I have low suspicion for ACS, PE, TAD, pneumothorax, carditis, mediastinitis, pneumonia, CHF, or sepsis.  COVID and flu are negative.  Chest x-ray viewed and interpreted by me and appears normal.  Radiology report reviewed.  We will start the patient on a course of prednisone and albuterol for symptomatic relief,  follow-up with primary care.      ____________________________________________   FINAL CLINICAL IMPRESSION(S) / ED DIAGNOSES    Final diagnoses:  Acute bronchitis, unspecified organism     ED Discharge Orders          Ordered    predniSONE (DELTASONE) 20 MG tablet  Daily with breakfast        05/17/21 2046    albuterol (PROVENTIL HFA) 108 (90 Base) MCG/ACT inhaler  Every 4 hours PRN        05/17/21 2046            Portions of this note were generated with dragon dictation software. Dictation errors may occur despite best attempts at proofreading.    Sharman Cheek, MD 05/17/21 2050

## 2021-05-19 NOTE — Telephone Encounter (Signed)
This encounter was created in error - please disregard.

## 2021-06-21 ENCOUNTER — Emergency Department
Admission: EM | Admit: 2021-06-21 | Discharge: 2021-06-21 | Disposition: A | Discharge status: Home / Self Care | Payer: 59 | Attending: Emergency Medicine | Admitting: Emergency Medicine

## 2021-06-21 ENCOUNTER — Other Ambulatory Visit: Payer: Self-pay

## 2021-06-21 ENCOUNTER — Emergency Department: Payer: 59

## 2021-06-21 ENCOUNTER — Encounter: Payer: Self-pay | Admitting: Emergency Medicine

## 2021-06-21 DIAGNOSIS — I10 Essential (primary) hypertension: Secondary | ICD-10-CM | POA: Diagnosis not present

## 2021-06-21 DIAGNOSIS — R002 Palpitations: Secondary | ICD-10-CM | POA: Diagnosis not present

## 2021-06-21 DIAGNOSIS — E876 Hypokalemia: Secondary | ICD-10-CM | POA: Insufficient documentation

## 2021-06-21 LAB — CBC
HCT: 38.9 % (ref 36.0–46.0)
Hemoglobin: 13 g/dL (ref 12.0–15.0)
MCH: 29.6 pg (ref 26.0–34.0)
MCHC: 33.4 g/dL (ref 30.0–36.0)
MCV: 88.6 fL (ref 80.0–100.0)
Platelets: 302 10*3/uL (ref 150–400)
RBC: 4.39 MIL/uL (ref 3.87–5.11)
RDW: 12.8 % (ref 11.5–15.5)
WBC: 11.7 10*3/uL — ABNORMAL HIGH (ref 4.0–10.5)
nRBC: 0 % (ref 0.0–0.2)

## 2021-06-21 LAB — BASIC METABOLIC PANEL
Anion gap: 6 (ref 5–15)
BUN: 12 mg/dL (ref 6–20)
CO2: 23 mmol/L (ref 22–32)
Calcium: 8.9 mg/dL (ref 8.9–10.3)
Chloride: 109 mmol/L (ref 98–111)
Creatinine, Ser: 0.54 mg/dL (ref 0.44–1.00)
GFR, Estimated: >60 mL/min (ref >60)
Glucose, Bld: 134 mg/dL — ABNORMAL HIGH (ref 70–99)
Potassium: 3.4 mmol/L — ABNORMAL LOW (ref 3.5–5.1)
Sodium: 138 mmol/L (ref 135–145)

## 2021-06-21 LAB — POC URINE PREG, ED: Preg Test, Ur: NEGATIVE

## 2021-06-21 LAB — URINALYSIS, ROUTINE W REFLEX MICROSCOPIC
Bilirubin Urine: NEGATIVE
Glucose, UA: NEGATIVE mg/dL
Hgb urine dipstick: NEGATIVE
Ketones, ur: 5 mg/dL — AB
Leukocytes,Ua: NEGATIVE
Nitrite: NEGATIVE
Protein, ur: 30 mg/dL — AB
Specific Gravity, Urine: 1.036 — ABNORMAL HIGH (ref 1.005–1.030)
pH: 5 (ref 5.0–8.0)

## 2021-06-21 LAB — MAGNESIUM: Magnesium: 2 mg/dL (ref 1.7–2.4)

## 2021-06-21 LAB — TSH: TSH: 3.164 u[IU]/mL (ref 0.350–4.500)

## 2021-06-21 LAB — TROPONIN I (HIGH SENSITIVITY)
Troponin I (High Sensitivity): 3 ng/L (ref <18)
Troponin I (High Sensitivity): 2 ng/L (ref <18)

## 2021-06-21 MED ORDER — POTASSIUM CHLORIDE CRYS ER 20 MEQ PO TBCR
40.0000 meq | EXTENDED_RELEASE_TABLET | Freq: Once | ORAL | Status: AC
  Administered 2021-06-21: 40 meq via ORAL
  Filled 2021-06-21: qty 2

## 2021-06-21 NOTE — ED Provider Notes (Signed)
[facility] Emergency Department Provider Note  ____________________________________________   Event Date/Time   First MD Initiated Contact with Patient 06/21/21 [id number]     (approximate)  I have reviewed the triage vital signs and the nursing notes.   HISTORY  Chief Complaint Palpitations   HPI Jaime Hernandez is a 25 y.o. female with a past medical history of obesity, PCOS and insulin resistance although not currently taking metformin due to some intolerance who presents for assessment of an episode of palpitations associate with some chest tightness with dizziness that occurred earlier this morning.  Patient states she gets off from work around 3 AM and was just lying down when she suddenly felt like her heart was racing and then "restarted".  She states she felt little dizzy and had some tightness but that all of the symptoms have since resolved.  She had similar episode about 2 days ago.  She states she currently feels completely back to baseline.  She denies any associated headache, earache, sore throat, vertigo, vision changes, back pain, cough, fever, shortness of breath, Donnell pain, nausea, vomiting, diarrhea, burning with urination, rash or extremity pain.  No recent falls or injuries.  She states she did recently start taking and also tall for PCOS over-the-counter not recommended by PCP or OB/GYN has been taking some ibuprofen but no other new medications.  No illicit drugs or alcohol.  No other acute concerns at this time.         Past Medical History:  Diagnosis Date   Obesity    PCOS (polycystic ovarian syndrome)    PCOS (polycystic ovarian syndrome)     There are no problems to display for this patient.   Past Surgical History:  Procedure Laterality Date   WISDOM TOOTH EXTRACTION      Prior to Admission medications   Medication Sig Start Date End Date Taking? Authorizing Provider  albuterol (PROVENTIL HFA) 108 (90 Base) MCG/ACT inhaler  Inhale 2 puffs into the lungs every 4 (four) hours as needed for wheezing or shortness of breath. 05/17/21   Sharman Cheek, MD  benzonatate (TESSALON) 100 MG capsule Take 1 capsule (100 mg total) by mouth 2 (two) times daily as needed for cough. 05/12/21   Freddy Finner, NP  ibuprofen (ADVIL) 800 MG tablet Take 1 tablet (800 mg total) by mouth every 8 (eight) hours as needed for moderate pain. 06/09/20   Irean Hong, MD  norethindrone-ethinyl estradiol-FE (JUNEL FE 1/20) 1-20 MG-MCG tablet Take 1 tablet by mouth daily. 04/07/21   Linzie Collin, MD  promethazine-dextromethorphan (PROMETHAZINE-DM) 6.25-15 MG/5ML syrup Take 2.5 mLs by mouth 4 (four) times daily as needed for cough. 05/12/21   Freddy Finner, NP    Allergies Acetaminophen-codeine  Family History  Problem Relation Age of Onset   Cancer Maternal Uncle 73       thyroid    Social History Social History   Tobacco Use   Smoking status: Never   Smokeless tobacco: Never  Vaping Use   Vaping Use: Some days   Substances: CBD  Substance Use Topics   Alcohol use: Not Currently    Comment: socially   Drug use: Yes    Types: Marijuana    Review of Systems  Review of Systems  Constitutional:  Negative for chills and fever.  HENT:  Negative for sore throat.   Eyes:  Negative for pain.  Respiratory:  Negative for cough and stridor.   Cardiovascular:  Positive for chest  pain and palpitations.  Gastrointestinal:  Negative for vomiting.  Genitourinary:  Negative for dysuria.  Musculoskeletal:  Negative for myalgias.  Skin:  Negative for rash.  Neurological:  Positive for dizziness. Negative for seizures, loss of consciousness and headaches.  Psychiatric/Behavioral:  Negative for suicidal ideas.   All other systems reviewed and are negative.    ____________________________________________   PHYSICAL EXAM:  VITAL SIGNS: ED Triage Vitals [06/21/21 0356]  Enc Vitals Group     BP (!) 144/79     Pulse Rate 83      Resp 18     Temp 98.7 F (37.1 C)     Temp Source Oral     SpO2 100 %     Weight 250 lb (113.4 kg)     Height 5\' 5"  (1.651 m)     Head Circumference      Peak Flow      Pain Score 0     Pain Loc      Pain Edu?      Excl. in GC?    Vitals:   06/21/21 0356 06/21/21 0730  BP: (!) 144/79 (!) 161/87  Pulse: 83 79  Resp: 18 18  Temp: 98.7 F (37.1 C)   SpO2: 100% 98%   Physical Exam Vitals and nursing note reviewed.  Constitutional:      General: She is not in acute distress.    Appearance: She is well-developed. She is obese.  HENT:     Head: Normocephalic and atraumatic.     Right Ear: External ear normal.     Left Ear: External ear normal.     Nose: Nose normal.  Eyes:     Conjunctiva/sclera: Conjunctivae normal.  Cardiovascular:     Rate and Rhythm: Normal rate and regular rhythm.     Heart sounds: No murmur heard. Pulmonary:     Effort: Pulmonary effort is normal. No respiratory distress.     Breath sounds: Normal breath sounds.  Abdominal:     Palpations: Abdomen is soft.     Tenderness: There is no abdominal tenderness.  Musculoskeletal:     Cervical back: Neck supple.  Skin:    General: Skin is warm and dry.     Capillary Refill: Capillary refill takes less than 2 seconds.  Neurological:     Mental Status: She is alert and oriented to person, place, and time.  Psychiatric:        Mood and Affect: Mood normal.    Lungs are clear bilaterally.  Patient is awake and alert and appropriate.  She is moving all extremities with symmetric strength. ____________________________________________   LABS (all labs ordered are listed, but only abnormal results are displayed)  Labs Reviewed  CBC - Abnormal; Notable for the following components:      Result Value   WBC 11.7 (*)    All other components within normal limits  BASIC METABOLIC PANEL - Abnormal; Notable for the following components:   Potassium 3.4 (*)    Glucose, Bld 134 (*)    All other components  within normal limits  URINALYSIS, ROUTINE W REFLEX MICROSCOPIC - Abnormal; Notable for the following components:   Color, Urine YELLOW (*)    APPearance HAZY (*)    Specific Gravity, Urine 1.036 (*)    Ketones, ur 5 (*)    Protein, ur 30 (*)    Bacteria, UA RARE (*)    All other components within normal limits  MAGNESIUM  TSH  POC URINE PREG,  ED  TROPONIN I (HIGH SENSITIVITY)  TROPONIN I (HIGH SENSITIVITY)   ____________________________________________  EKG  Chest x-ray shows a ventricular rate of 83 with sinus rhythm, normal axis, unremarkable intervals with nonspecific ST change in V2 and V3 without other clear evidence of acute ischemia or significant arrhythmia. ____________________________________________  RADIOLOGY  ED MD interpretation:  Chest x-ray without evidence of pneumonia, pneumothorax, effusion or edema or other clear acute thoracic process.  Official radiology report(s): DG Chest 2 View  Result Date: 06/21/2021 CLINICAL DATA:  Palpitations. EXAM: CHEST - 2 VIEW COMPARISON:  September 11, 2020. FINDINGS: The heart size and mediastinal contours are within normal limits. Both lungs are clear. No visible pleural effusions or pneumothorax. No acute osseous abnormality. IMPRESSION: No evidence of acute cardiopulmonary disease Electronically Signed   By: Feliberto Harts M.D.   On: 06/21/2021 08:22    ____________________________________________   PROCEDURES  Procedure(s) performed (including Critical Care):  Procedures   ____________________________________________   INITIAL IMPRESSION / ASSESSMENT AND PLAN / ED COURSE      Patient presents with above-stated history exam for assessment of an episode of palpitations associate with some dizziness and chest tightness that occurred earlier this morning as described above.  This is similar to another episode that occurred 2 days ago.  These episodes seem to last less than an hour and patient currently  asymptomatic.  On arrival she is hypertensive with otherwise stable vital signs on room air.  Her lungs are clear bilaterally and her abdomen is soft.  Differential includes ACS, paroxysmal arrhythmia, anemia, metabolic derangements, hypothyroidism, side effects of inositol or PCOS with lower suspicion at this time for PE or dissection given paroxysmal nature of symptoms with patient denying any symptoms at this time and no evidence of hypoxia tachypnea or tachycardia and no associated back pain or clear risk factors for dissection.  ECG and nonelevated troponin x2 are not suggestive of ACS or myocarditis.  No significant arrhythmia identified.  BMP shows a K of 3.4 but no other significant electrolyte or metabolic derangements.  TSH is within normal limits.  Magnesium is within normal months.  CBC with WBC count of 11.7 compared to 12.210 months ago without evidence of acute anemia.  Pregnancy test is negative.  Urine has some ketones and protein and rare bacteria but no evidence of infection.  Chest x-ray without evidence of pneumonia, pneumothorax, effusion or edema or other clear acute thoracic process.   Given stable vitals otherwise reassuring exam and work-up with low suspicion for immediate life-threatening process I think patient is stable for discharge with close outpatient PCP and OB/GYN follow-up.  Discussed with holding inositol at this time given this is only new medication patient has recently started as it is possible this is related to her symptoms.  Discharged in stable condition.  Strict return precautions advised and discussed.        ____________________________________________   FINAL CLINICAL IMPRESSION(S) / ED DIAGNOSES  Final diagnoses:  Palpitations  Hypertension, unspecified type  Hypokalemia    Medications  potassium chloride SA (KLOR-CON) CR tablet 40 mEq (40 mEq Oral Given 06/21/21 0758)     ED Discharge Orders     None        Note:  This document  was prepared using Dragon voice recognition software and may include unintentional dictation errors.    Gilles Chiquito, MD 06/21/21 [phone]

## 2021-06-21 NOTE — ED Triage Notes (Signed)
Pt to triage via w/c with no distress noted; c/o "heart pounding, dizziness and chest tightness tonight"

## 2021-06-21 NOTE — ED Notes (Signed)
Patient transported to X-ray 

## 2021-06-22 ENCOUNTER — Emergency Department: Payer: 59

## 2021-06-22 ENCOUNTER — Emergency Department
Admission: EM | Admit: 2021-06-22 | Discharge: 2021-06-22 | Disposition: A | Discharge status: Home / Self Care | Payer: 59 | Attending: Emergency Medicine | Admitting: Emergency Medicine

## 2021-06-22 ENCOUNTER — Other Ambulatory Visit: Payer: Self-pay

## 2021-06-22 DIAGNOSIS — R531 Weakness: Secondary | ICD-10-CM | POA: Diagnosis not present

## 2021-06-22 DIAGNOSIS — R079 Chest pain, unspecified: Secondary | ICD-10-CM | POA: Diagnosis not present

## 2021-06-22 DIAGNOSIS — I10 Essential (primary) hypertension: Secondary | ICD-10-CM | POA: Diagnosis not present

## 2021-06-22 DIAGNOSIS — R110 Nausea: Secondary | ICD-10-CM | POA: Diagnosis not present

## 2021-06-22 DIAGNOSIS — R002 Palpitations: Secondary | ICD-10-CM | POA: Insufficient documentation

## 2021-06-22 LAB — BASIC METABOLIC PANEL
Anion gap: 6 (ref 5–15)
BUN: 10 mg/dL (ref 6–20)
CO2: 23 mmol/L (ref 22–32)
Calcium: 9.5 mg/dL (ref 8.9–10.3)
Chloride: 108 mmol/L (ref 98–111)
Creatinine, Ser: 0.64 mg/dL (ref 0.44–1.00)
GFR, Estimated: >60 mL/min (ref >60)
Glucose, Bld: 90 mg/dL (ref 70–99)
Potassium: 3.8 mmol/L (ref 3.5–5.1)
Sodium: 137 mmol/L (ref 135–145)

## 2021-06-22 LAB — POC URINE PREG, ED: Preg Test, Ur: NEGATIVE

## 2021-06-22 LAB — CBC
HCT: 40.1 % (ref 36.0–46.0)
Hemoglobin: 13.1 g/dL (ref 12.0–15.0)
MCH: 29.2 pg (ref 26.0–34.0)
MCHC: 32.7 g/dL (ref 30.0–36.0)
MCV: 89.5 fL (ref 80.0–100.0)
Platelets: 290 10*3/uL (ref 150–400)
RBC: 4.48 MIL/uL (ref 3.87–5.11)
RDW: 13.2 % (ref 11.5–15.5)
WBC: 10.1 10*3/uL (ref 4.0–10.5)
nRBC: 0 % (ref 0.0–0.2)

## 2021-06-22 LAB — TROPONIN I (HIGH SENSITIVITY): Troponin I (High Sensitivity): <2 ng/L (ref <18)

## 2021-06-22 MED ORDER — ONDANSETRON 4 MG PO TBDP
4.0000 mg | ORAL_TABLET | Freq: Once | ORAL | Status: AC
  Administered 2021-06-22: 22:00:00 4 mg via ORAL
  Filled 2021-06-22: qty 1

## 2021-06-22 MED ORDER — HYDROXYZINE HCL 25 MG PO TABS
25.0000 mg | ORAL_TABLET | Freq: Once | ORAL | Status: AC
  Administered 2021-06-22: 22:00:00 25 mg via ORAL
  Filled 2021-06-22: qty 1

## 2021-06-22 MED ORDER — HYDROXYZINE HCL 25 MG PO TABS
25.0000 mg | ORAL_TABLET | Freq: Three times a day (TID) | ORAL | 0 refills | Status: DC | PRN
  Filled 2021-06-22: qty 20, 7d supply, fill #0

## 2021-06-22 NOTE — ED Provider Notes (Signed)
[facility] Emergency Department Provider Note  Time seen: 9:21 PM  I have reviewed the triage vital signs and the nursing notes.   HISTORY  Chief Complaint Palpitations    HPI Jaime Hernandez is a 25 y.o. female with a past medical history of PCOS presents to the emergency department for palpitations and weakness type feeling.  According to the patient this evening around 5:00 she began feeling she describes as palpitations or chest tightness along with the nausea and weakness feeling.  Patient states she did not know if she was having a "panic attack."  No history of anxiety previously.  Patient states several days ago she was seen for similar palpitations but had a reassuring work-up and was discharged home.  Patient denies any chest pain currently.  States she is feeling well.  Largely negative review of systems.   Past Medical History:  Diagnosis Date   Obesity    PCOS (polycystic ovarian syndrome)    PCOS (polycystic ovarian syndrome)     There are no problems to display for this patient.   Past Surgical History:  Procedure Laterality Date   WISDOM TOOTH EXTRACTION      Prior to Admission medications   Medication Sig Start Date End Date Taking? Authorizing Provider  albuterol (PROVENTIL HFA) 108 (90 Base) MCG/ACT inhaler Inhale 2 puffs into the lungs every 4 (four) hours as needed for wheezing or shortness of breath. 05/17/21   Sharman Cheek, MD  benzonatate (TESSALON) 100 MG capsule Take 1 capsule (100 mg total) by mouth 2 (two) times daily as needed for cough. 05/12/21   Freddy Finner, NP  ibuprofen (ADVIL) 800 MG tablet Take 1 tablet (800 mg total) by mouth every 8 (eight) hours as needed for moderate pain. 06/09/20   Irean Hong, MD  norethindrone-ethinyl estradiol-FE (JUNEL FE 1/20) 1-20 MG-MCG tablet Take 1 tablet by mouth daily. 04/07/21   Linzie Collin, MD  promethazine-dextromethorphan (PROMETHAZINE-DM) 6.25-15 MG/5ML syrup Take 2.5  mLs by mouth 4 (four) times daily as needed for cough. 05/12/21   Freddy Finner, NP    Allergies  Allergen Reactions   Acetaminophen-Codeine Other (See Comments)    Family History  Problem Relation Age of Onset   Cancer Maternal Uncle 62       thyroid    Social History Social History   Tobacco Use   Smoking status: Never   Smokeless tobacco: Never  Vaping Use   Vaping Use: Some days   Substances: CBD  Substance Use Topics   Alcohol use: Not Currently    Comment: socially   Drug use: Yes    Types: Marijuana    Review of Systems Constitutional: Negative for fever.  Positive for weakness earlier today, now resolved Cardiovascular: Negative for chest pain.  Positive for palpitations. Respiratory: Negative for shortness of breath.  Negative for cough. Gastrointestinal: Negative for abdominal pain, vomiting and diarrhea. Musculoskeletal: Negative for musculoskeletal complaints Neurological: Negative for headache All other ROS negative  ____________________________________________   PHYSICAL EXAM:  VITAL SIGNS: ED Triage Vitals  Enc Vitals Group     BP 06/22/21 1759 (!) 132/93     Pulse Rate 06/22/21 1759 87     Resp 06/22/21 1759 18     Temp 06/22/21 1759 98.5 F (36.9 C)     Temp Source 06/22/21 1759 Oral     SpO2 06/22/21 1759 100 %     Weight 06/22/21 1800 250 lb (113.4 kg)  Height 06/22/21 1800 5\' 5"  (1.651 m)     Head Circumference --      Peak Flow --      Pain Score 06/22/21 1800 6     Pain Loc --      Pain Edu? --      Excl. in GC? --    Constitutional: Alert and oriented. Well appearing and in no distress. Eyes: Normal exam ENT      Head: Normocephalic and atraumatic.      Mouth/Throat: Mucous membranes are moist. Cardiovascular: Normal rate, regular rhythm.  Respiratory: Normal respiratory effort without tachypnea nor retractions. Breath sounds are clear  Gastrointestinal: Soft and nontender. No distention.  Musculoskeletal: Nontender  with normal range of motion in all extremities.  Neurologic:  Normal speech and language. No gross focal neurologic deficits Skin:  Skin is warm, dry and intact.  Psychiatric: Mood and affect are normal.   ____________________________________________    EKG  EKG viewed and interpreted by myself shows a normal sinus rhythm 81 bpm with a narrow QRS, normal axis, normal intervals, no concerning ST changes.  ____________________________________________    RADIOLOGY  Chest x-ray is negative  ____________________________________________   INITIAL IMPRESSION / ASSESSMENT AND PLAN / ED COURSE  Pertinent labs & imaging results that were available during my care of the patient were reviewed by me and considered in my medical decision making (see chart for details).   Patient presents emergency department for palpitations and weakness.  Patient's description of the symptoms are very suggestive of possible anxiety or panic disorder.  Patient's work-up in the emergency department is very reassuring including vitals physical exam, reassuring labs and a negative troponin.  EKG appears normal as well.  Given the patient's reassuring work-up I do believe she is safe for discharge home I did recommend a trial of hydroxyzine to be used if any symptoms were to occur.  I also recommended she follow-up with cardiology for consideration of Holter monitor testing.  Patient agreeable to plan of care.  Discussed return precautions.  Jaime Hernandez was evaluated in Emergency Department on 06/22/2021 for the symptoms described in the history of present illness. She was evaluated in the context of the global COVID-19 pandemic, which necessitated consideration that the patient might be at risk for infection with the SARS-CoV-2 virus that causes COVID-19. Institutional protocols and algorithms that pertain to the evaluation of patients at risk for COVID-19 are in a state of rapid change based on information released by  regulatory bodies including the CDC and federal and state organizations. These policies and algorithms were followed during the patient's care in the ED.  ____________________________________________   FINAL CLINICAL IMPRESSION(S) / ED DIAGNOSES  Palpitations   06/24/2021, MD 06/22/21 2124

## 2021-06-22 NOTE — ED Triage Notes (Signed)
Pt states she has been having chest tightness, heart palpitations, and high bp- pt states that she was seen here for same in the past couple days and was told to stop taking her medication which she has but today at work her symptoms came back around 1730

## 2021-06-23 ENCOUNTER — Other Ambulatory Visit: Payer: Self-pay

## 2021-07-11 ENCOUNTER — Other Ambulatory Visit (HOSPITAL_COMMUNITY)
Admission: RE | Admit: 2021-07-11 | Discharge: 2021-07-11 | Disposition: A | Discharge status: Home / Self Care | Payer: 59 | Source: Ambulatory Visit | Attending: Obstetrics and Gynecology | Admitting: Obstetrics and Gynecology

## 2021-07-11 ENCOUNTER — Ambulatory Visit (INDEPENDENT_AMBULATORY_CARE_PROVIDER_SITE_OTHER): Payer: 59 | Admitting: Obstetrics and Gynecology

## 2021-07-11 ENCOUNTER — Other Ambulatory Visit: Payer: Self-pay

## 2021-07-11 ENCOUNTER — Encounter: Payer: Self-pay | Admitting: Obstetrics and Gynecology

## 2021-07-11 VITALS — BP 123/80 | HR 95 | Resp 16 | Ht 65.0 in | Wt 244.3 lb

## 2021-07-11 DIAGNOSIS — E8881 Metabolic syndrome: Secondary | ICD-10-CM

## 2021-07-11 DIAGNOSIS — E282 Polycystic ovarian syndrome: Secondary | ICD-10-CM | POA: Diagnosis not present

## 2021-07-11 DIAGNOSIS — Z113 Encounter for screening for infections with a predominantly sexual mode of transmission: Secondary | ICD-10-CM | POA: Diagnosis not present

## 2021-07-11 DIAGNOSIS — Z01419 Encounter for gynecological examination (general) (routine) without abnormal findings: Secondary | ICD-10-CM

## 2021-07-11 DIAGNOSIS — Z30011 Encounter for initial prescription of contraceptive pills: Secondary | ICD-10-CM | POA: Diagnosis not present

## 2021-07-11 MED ORDER — METFORMIN HCL 500 MG PO TABS
1000.0000 mg | ORAL_TABLET | Freq: Two times a day (BID) | ORAL | 2 refills | Status: DC
  Filled 2021-07-11: qty 100, 25d supply, fill #0

## 2021-07-11 MED ORDER — DESOGESTREL-ETHINYL ESTRADIOL 0.15-0.02/0.01 MG (21/5) PO TABS
1.0000 | ORAL_TABLET | Freq: Every day | ORAL | 3 refills | Status: DC
  Filled 2021-07-11: qty 84, 84d supply, fill #0

## 2021-07-11 NOTE — Progress Notes (Signed)
HPI:      Jaime Hernandez is a 25 y.o. No obstetric history on file. who LMP was Patient's last menstrual period was 06/10/2021 (exact date).  Subjective:   She presents today for her annual examination.  She states that she has stopped her OCPs and her metformin.  She says that she had some trouble with [facility] getting her prescription and she got "tired of taking it anyway".  She has a history of PCO and insulin resistance.  She desires pregnancy in the next year and is thinking possibly about May.    Hx: The following portions of the patient's history were reviewed and updated as appropriate:             She  has a past medical history of Obesity, PCOS (polycystic ovarian syndrome), and PCOS (polycystic ovarian syndrome). She does not have a problem list on file. She  has a past surgical history that includes Wisdom tooth extraction. Her family history includes Cancer (age of onset: 31) in her maternal uncle. She  reports that she has never smoked. She has never used smokeless tobacco. She reports that she does not currently use alcohol. She reports current drug use. Drug: Marijuana. She has a current medication list which includes the following prescription(s): albuterol, desogestrel-ethinyl estradiol, hydroxyzine, and metformin. She is allergic to acetaminophen-codeine.       Review of Systems:  Review of Systems  Constitutional: Denied constitutional symptoms, night sweats, recent illness, fatigue, fever, insomnia and weight loss.  Eyes: Denied eye symptoms, eye pain, photophobia, vision change and visual disturbance.  Ears/Nose/Throat/Neck: Denied ear, nose, throat or neck symptoms, hearing loss, nasal discharge, sinus congestion and sore throat.  Cardiovascular: Denied cardiovascular symptoms, arrhythmia, chest pain/pressure, edema, exercise intolerance, orthopnea and palpitations.  Respiratory: Denied pulmonary symptoms, asthma, pleuritic pain, productive sputum, cough, dyspnea and  wheezing.  Gastrointestinal: Denied, gastro-esophageal reflux, melena, nausea and vomiting.  Genitourinary: Denied genitourinary symptoms including symptomatic vaginal discharge, pelvic relaxation issues, and urinary complaints.  Musculoskeletal: Denied musculoskeletal symptoms, stiffness, swelling, muscle weakness and myalgia.  Dermatologic: Denied dermatology symptoms, rash and scar.  Neurologic: Denied neurology symptoms, dizziness, headache, neck pain and syncope.  Psychiatric: Denied psychiatric symptoms, anxiety and depression.  Endocrine: Denied endocrine symptoms including hot flashes and night sweats.   Meds:   Current Outpatient Medications on File Prior to Visit  Medication Sig Dispense Refill   albuterol (PROVENTIL HFA) 108 (90 Base) MCG/ACT inhaler Inhale 2 puffs into the lungs every 4 (four) hours as needed for wheezing or shortness of breath. 1 each 0   hydrOXYzine (ATARAX/VISTARIL) 25 MG tablet Take 1 tablet (25 mg total) by mouth 3 (three) times daily as needed for anxiety. 20 tablet 0   No current facility-administered medications on file prior to visit.     Objective:     Vitals:   07/11/21 0900  BP: 123/80  Pulse: 95  Resp: 16    Filed Weights   07/11/21 0900  Weight: 244 lb 4.8 oz (110.8 kg)              Physical examination General NAD, Conversant  HEENT Atraumatic; Op clear with mmm.  Normo-cephalic. Pupils reactive. Anicteric sclerae  Thyroid/Neck Smooth without nodularity or enlargement. Normal ROM.  Neck Supple.  Skin No rashes, lesions or ulceration. Normal palpated skin turgor. No nodularity.  Breasts: No masses or discharge.  Symmetric.  No axillary adenopathy.  Lungs: Clear to auscultation.No rales or wheezes. Normal Respiratory effort, no retractions.  Heart:  NSR.  No murmurs or rubs appreciated. No periferal edema  Abdomen: Soft.  Non-tender.  No masses.  No HSM. No hernia  Extremities: Moves all appropriately.  Normal ROM for age. No  lymphadenopathy.  Neuro: Oriented to PPT.  Normal mood. Normal affect.     Pelvic:   Vulva: Normal appearance.  No lesions.  Vagina: No lesions or abnormalities noted.  Support: Normal pelvic support.  Urethra No masses tenderness or scarring.  Meatus Normal size without lesions or prolapse.  Cervix: Normal appearance.  No lesions.  Anus: Normal exam.  No lesions.  Perineum: Normal exam.  No lesions.        Bimanual   Uterus: Normal size.  Non-tender.  Mobile.  AV.  Adnexae: No masses.  Non-tender to palpation.  Cul-de-sac: Negative for abnormality.     Assessment:    No obstetric history on file. There are no problems to display for this patient.    1. Well woman exam with routine gynecological exam   2. PCO (polycystic ovaries)   3. Insulin resistance   4. Initiation of OCP (BCP)   5. Screen for STD (sexually transmitted disease)        Plan:            1.  Basic Screening Recommendations The basic screening recommendations for asymptomatic women were discussed with the patient during her visit.  The age-appropriate recommendations were discussed with her and the rational for the tests reviewed.  When I am informed by the patient that another primary care physician has previously obtained the age-appropriate tests and they are up-to-date, only outstanding tests are ordered and referrals given as necessary.  Abnormal results of tests will be discussed with her when all of her results are completed.  Routine preventative health maintenance measures emphasized: Exercise/Diet/Weight control, Tobacco Warnings, Alcohol/Substance use risks and Stress Management GC/CT performed on urine. 2.  We have once again discussed the importance of regular cycling and how OCPs are a very good treatment for PCO.  The necessity of metformin for insulin resistance was also discussed.  Patient is willing to restart both medications.  She will contact us prior to attempting pregnancy possibly as  early as Monday 2023.  All questions answered.  Orders No orders of the defined types were placed in this encounter.    Meds ordered this encounter  Medications   metFORMIN (GLUCOPHAGE) 500 MG tablet    Sig: Take 2 tablets (1,000 mg total) by mouth 2 (two) times daily with a meal. 500 at bedtime for 1 wk.  Then increase to twice per day with meals as directed.    Dispense:  100 tablet    Refill:  2   desogestrel-ethinyl estradiol (MIRCETTE) 0.15-0.02/0.01 MG (21/5) tablet    Sig: Take 1 tablet by mouth at bedtime.    Dispense:  90 tablet    Refill:  3           F/U  Return in about 1 year (around 07/11/2022) for Annual Physical.  Finis Bud, M.D. 07/11/2021 9:31 AM

## 2021-07-12 LAB — URINE CYTOLOGY ANCILLARY ONLY
Chlamydia: NEGATIVE
Comment: NEGATIVE
Comment: NORMAL
Neisseria Gonorrhea: NEGATIVE

## 2021-07-31 ENCOUNTER — Other Ambulatory Visit: Payer: Self-pay

## 2021-07-31 DIAGNOSIS — R5383 Other fatigue: Secondary | ICD-10-CM | POA: Insufficient documentation

## 2021-07-31 DIAGNOSIS — R42 Dizziness and giddiness: Secondary | ICD-10-CM | POA: Diagnosis not present

## 2021-07-31 DIAGNOSIS — R002 Palpitations: Secondary | ICD-10-CM | POA: Diagnosis not present

## 2021-07-31 DIAGNOSIS — R55 Syncope and collapse: Secondary | ICD-10-CM | POA: Insufficient documentation

## 2021-07-31 DIAGNOSIS — R9431 Abnormal electrocardiogram [ECG] [EKG]: Secondary | ICD-10-CM | POA: Diagnosis not present

## 2021-07-31 DIAGNOSIS — Z87891 Personal history of nicotine dependence: Secondary | ICD-10-CM | POA: Diagnosis not present

## 2021-07-31 LAB — URINALYSIS, ROUTINE W REFLEX MICROSCOPIC
Bilirubin Urine: NEGATIVE
Glucose, UA: NEGATIVE mg/dL
Ketones, ur: NEGATIVE mg/dL
Nitrite: NEGATIVE
Protein, ur: NEGATIVE mg/dL
Specific Gravity, Urine: >1.03 — ABNORMAL HIGH (ref 1.005–1.030)
pH: 5.5 (ref 5.0–8.0)

## 2021-07-31 LAB — URINALYSIS, MICROSCOPIC (REFLEX)

## 2021-07-31 LAB — BASIC METABOLIC PANEL
Anion gap: 6 (ref 5–15)
BUN: 9 mg/dL (ref 6–20)
CO2: 24 mmol/L (ref 22–32)
Calcium: 9 mg/dL (ref 8.9–10.3)
Chloride: 107 mmol/L (ref 98–111)
Creatinine, Ser: 0.58 mg/dL (ref 0.44–1.00)
GFR, Estimated: >60 mL/min (ref >60)
Glucose, Bld: 123 mg/dL — ABNORMAL HIGH (ref 70–99)
Potassium: 3.8 mmol/L (ref 3.5–5.1)
Sodium: 137 mmol/L (ref 135–145)

## 2021-07-31 LAB — CBC
HCT: 41.7 % (ref 36.0–46.0)
Hemoglobin: 13.5 g/dL (ref 12.0–15.0)
MCH: 29.2 pg (ref 26.0–34.0)
MCHC: 32.4 g/dL (ref 30.0–36.0)
MCV: 90.1 fL (ref 80.0–100.0)
Platelets: 316 10*3/uL (ref 150–400)
RBC: 4.63 MIL/uL (ref 3.87–5.11)
RDW: 13.4 % (ref 11.5–15.5)
WBC: 10.8 10*3/uL — ABNORMAL HIGH (ref 4.0–10.5)
nRBC: 0 % (ref 0.0–0.2)

## 2021-07-31 LAB — POC URINE PREG, ED: Preg Test, Ur: NEGATIVE

## 2021-07-31 LAB — CBG MONITORING, ED: Glucose-Capillary: 119 mg/dL — ABNORMAL HIGH (ref 70–99)

## 2021-07-31 NOTE — ED Triage Notes (Signed)
Pt comes into the ED via wheelchair with c/I intermittent episodes of dizziness and heart palpitations over the past 8 weeks, lasting about , pt is in NAD.

## 2021-08-01 ENCOUNTER — Emergency Department
Admission: EM | Admit: 2021-08-01 | Discharge: 2021-08-01 | Disposition: A | Discharge status: Home / Self Care | Payer: 59 | Attending: Emergency Medicine | Admitting: Emergency Medicine

## 2021-08-01 DIAGNOSIS — R42 Dizziness and giddiness: Secondary | ICD-10-CM

## 2021-08-01 DIAGNOSIS — R5383 Other fatigue: Secondary | ICD-10-CM

## 2021-08-01 NOTE — Discharge Instructions (Signed)
Your workup in the Emergency Department today was reassuring.  We did not find any specific abnormalities.  We recommend you drink plenty of fluids, take your regular medications and/or any new ones prescribed today, and follow up with the doctor(s) listed in these documents as recommended.  Return to the Emergency Department if you develop new or worsening symptoms that concern you.  

## 2021-08-01 NOTE — ED Notes (Signed)
Pt in triage room 3. Dr. York Cerise at bedside.

## 2021-08-01 NOTE — ED Provider Notes (Signed)
[facility] Emergency Department Provider Note  ____________________________________________   Event Date/Time   First MD Initiated Contact with Patient 08/01/21 [phone]     (approximate)  I have reviewed the triage vital signs and the nursing notes.   HISTORY  Chief Complaint Near Syncope and Palpitations    HPI Jaime Hernandez is a 25 y.o. female who presents for evaluation of fatigue, not having much energy, and occasional palpitations.  She has had several visits in the past for palpitations and near syncope.  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  She has not been feeling palpitations recently.  She is not having chest pain.  Occasionally she has intermittent abdominal pain "from my ovaries" (history of PCOS).  She is not having any pain right now.  She denies fever, sore throat, chest pain, shortness of breath, nausea, vomiting, dysuria.  She was last seen in the emergency department for palpitations about a month ago but has not yet had the opportunity to follow-up with cardiology, although she states she has made a phone call.  She does not have a primary care provider currently.  She describes the fatigue as severe and nothing in particular makes it better or worse.     Past Medical History:  Diagnosis Date   Obesity    PCOS (polycystic ovarian syndrome)    PCOS (polycystic ovarian syndrome)     There are no problems to display for this patient.   Past Surgical History:  Procedure Laterality Date   WISDOM TOOTH EXTRACTION      Prior to Admission medications   Medication Sig Start Date End Date Taking? Authorizing Provider  albuterol (PROVENTIL HFA) 108 (90 Base) MCG/ACT inhaler Inhale 2 puffs into the lungs every 4 (four) hours as needed for wheezing or shortness of breath. 05/17/21   Sharman Cheek, MD  desogestrel-ethinyl estradiol (MIRCETTE) 0.15-0.02/0.01 MG  (21/5) tablet Take 1 tablet by mouth at bedtime. 07/11/21 07/11/22  Linzie Collin, MD  hydrOXYzine (ATARAX/VISTARIL) 25 MG tablet Take 1 tablet (25 mg total) by mouth 3 (three) times daily as needed for anxiety. 06/22/21   Minna Antis, MD  metFORMIN (GLUCOPHAGE) 500 MG tablet Take 2 tablets (1,000 mg total) by mouth 2 (two) times daily with a meal. 500 at bedtime for 1 wk.  Then increase to twice per day with meals as directed. 07/11/21   Linzie Collin, MD    Allergies Acetaminophen-codeine  Family History  Problem Relation Age of Onset   Cancer Maternal Uncle 27       thyroid    Social History Social History   Tobacco Use   Smoking status: Former    Types: Cigarettes   Smokeless tobacco: Never  Vaping Use   Vaping Use: Some days   Substances: CBD  Substance Use Topics   Alcohol use: Not Currently    Comment: socially   Drug use: Yes    Types: Marijuana    Review of Systems Constitutional: Positive for fatigue.  No fever/chills Eyes: No visual changes. ENT: No sore throat. Cardiovascular: Denies chest pain. Respiratory: Denies shortness of breath. Gastrointestinal: No abdominal pain.  No nausea, no vomiting.  No diarrhea.  No constipation. Genitourinary: Negative for dysuria. Musculoskeletal: Negative for neck pain.  Negative for back pain. Integumentary: Negative for rash. Neurological: Negative for headaches, focal weakness or numbness.   ____________________________________________   PHYSICAL EXAM:  VITAL  SIGNS: ED Triage Vitals  Enc Vitals Group     BP 07/31/21 1604 125/87     Pulse Rate 07/31/21 1604 98     Resp 07/31/21 1604 18     Temp 07/31/21 1604 97.8 F (36.6 C)     Temp src --      SpO2 07/31/21 1604 98 %     Weight 07/31/21 1555 108.9 kg (240 lb)     Height 07/31/21 1555 1.651 m (5\' 5" )     Head Circumference --      Peak Flow --      Pain Score 07/31/21 1555 0     Pain Loc --      Pain Edu? --      Excl. in GC? --      Constitutional: Alert and oriented.  Eyes: Conjunctivae are normal.  Head: Atraumatic. Nose: No congestion/rhinnorhea. Mouth/Throat: Patient is wearing a mask. Neck: No stridor.  No meningeal signs.   Cardiovascular: Normal rate, regular rhythm. Good peripheral circulation. Respiratory: Normal respiratory effort.  No retractions. Gastrointestinal: Soft and nontender. No distention.  Musculoskeletal: No lower extremity tenderness nor edema. No gross deformities of extremities. Neurologic:  Normal speech and language. No gross focal neurologic deficits are appreciated.  Skin:  Skin is warm, dry and intact. Psychiatric: Mood and affect are normal. Speech and behavior are normal.  ____________________________________________   LABS (all labs ordered are listed, but only abnormal results are displayed)  Labs Reviewed  BASIC METABOLIC PANEL - Abnormal; Notable for the following components:      Result Value   Glucose, Bld 123 (*)    All other components within normal limits  CBC - Abnormal; Notable for the following components:   WBC 10.8 (*)    All other components within normal limits  URINALYSIS, ROUTINE W REFLEX MICROSCOPIC - Abnormal; Notable for the following components:   Specific Gravity, Urine >1.030 (*)    Hgb urine dipstick TRACE (*)    Leukocytes,Ua TRACE (*)    All other components within normal limits  URINALYSIS, MICROSCOPIC (REFLEX) - Abnormal; Notable for the following components:   Bacteria, UA FEW (*)    All other components within normal limits  CBG MONITORING, ED - Abnormal; Notable for the following components:   Glucose-Capillary 119 (*)    All other components within normal limits  POC URINE PREG, ED   ____________________________________________  EKG  ED ECG REPORT I, 08/02/21, the attending physician, personally viewed and interpreted this ECG.  Date: 07/31/2021 EKG Time: 16: 09 Rate: 94 Rhythm: normal sinus rhythm QRS Axis:  normal Intervals: normal ST/T Wave abnormalities: normal Narrative Interpretation: no evidence of acute ischemia  ____________________________________________   INITIAL IMPRESSION / MDM / ASSESSMENT AND PLAN / ED COURSE  As part of my medical decision making, I reviewed the following data within the electronic MEDICAL RECORD NUMBER Nursing notes reviewed and incorporated, Labs reviewed , EKG interpreted , Old chart reviewed, and Notes from prior ED visits   Differential diagnosis includes, but is not limited to, depression, anxiety, viral infection, cardiac abnormality.  Vital signs are stable and within normal limits.  EKG shows no concerning abnormalities nor sign of ischemia.  Urinalysis generally unremarkable with a few squamous cells and minimal bacteria WBCs.  Strongly doubt UTI.  Basic metabolic panel and CBC are essentially normal.  Patient is not having chest pain.  I suspect that anxiety and/or depression are strongly contributing to her symptoms as well as her prior visits  for palpitations.  We talked about this possibility.  I encouraged her to follow-up as an outpatient with primary care and cardiology and she says she understands the importance.  There is no evidence of an acute or emergent medical condition at this time and she is appropriate for discharge.  I gave my usual and customary return precautions.   ____________________________________________  FINAL CLINICAL IMPRESSION(S) / ED DIAGNOSES  Final diagnoses:  Lightheadedness  Other fatigue     MEDICATIONS GIVEN DURING THIS VISIT:  Medications - No data to display   ED Discharge Orders     None        Note:  This document was prepared using Dragon voice recognition software and may include unintentional dictation errors.   Loleta Rose, MD 08/01/21 [phone]

## 2021-08-03 ENCOUNTER — Other Ambulatory Visit: Payer: Self-pay | Admitting: Family Medicine

## 2021-08-03 DIAGNOSIS — Z6841 Body Mass Index (BMI) 40.0 and over, adult: Secondary | ICD-10-CM | POA: Diagnosis not present

## 2021-08-03 DIAGNOSIS — R002 Palpitations: Secondary | ICD-10-CM

## 2021-08-03 DIAGNOSIS — R300 Dysuria: Secondary | ICD-10-CM | POA: Diagnosis not present

## 2021-08-03 DIAGNOSIS — E860 Dehydration: Secondary | ICD-10-CM | POA: Diagnosis not present

## 2021-08-06 DIAGNOSIS — R002 Palpitations: Secondary | ICD-10-CM

## 2021-08-06 HISTORY — DX: Palpitations: R00.2

## 2021-08-10 ENCOUNTER — Ambulatory Visit
Admission: RE | Admit: 2021-08-10 | Discharge: 2021-08-10 | Disposition: A | Discharge status: Home / Self Care | Payer: 59 | Source: Ambulatory Visit | Attending: Family Medicine | Admitting: Family Medicine

## 2021-08-10 ENCOUNTER — Other Ambulatory Visit: Payer: Self-pay

## 2021-08-10 DIAGNOSIS — R55 Syncope and collapse: Secondary | ICD-10-CM | POA: Insufficient documentation

## 2021-08-10 DIAGNOSIS — R002 Palpitations: Secondary | ICD-10-CM | POA: Insufficient documentation

## 2021-08-10 LAB — ECHOCARDIOGRAM COMPLETE
AR max vel: 2.97 cm2
AV Area VTI: 2.96 cm2
AV Area mean vel: 3.15 cm2
AV Mean grad: 4 mmHg
AV Peak grad: 7.5 mmHg
Ao pk vel: 1.37 m/s
Area-P 1/2: 6.43 cm2
MV VTI: 4.02 cm2
S' Lateral: 3.33 cm

## 2021-08-10 NOTE — Progress Notes (Signed)
*  PRELIMINARY RESULTS* Echocardiogram 2D Echocardiogram has been performed.  Jaime Hernandez 08/10/2021, 9:24 AM

## 2021-09-19 ENCOUNTER — Other Ambulatory Visit: Payer: Self-pay

## 2021-09-19 MED ORDER — PROPRANOLOL HCL 10 MG PO TABS
ORAL_TABLET | ORAL | 0 refills | Status: DC
  Filled 2021-09-19: qty 60, 30d supply, fill #0

## 2021-11-07 ENCOUNTER — Ambulatory Visit (INDEPENDENT_AMBULATORY_CARE_PROVIDER_SITE_OTHER): Payer: 59 | Admitting: Obstetrics and Gynecology

## 2021-11-07 ENCOUNTER — Encounter: Payer: Self-pay | Admitting: Obstetrics and Gynecology

## 2021-11-07 VITALS — BP 120/91 | HR 114 | Ht 65.0 in | Wt 235.9 lb

## 2021-11-07 DIAGNOSIS — Z3169 Encounter for other general counseling and advice on procreation: Secondary | ICD-10-CM

## 2021-11-07 NOTE — Progress Notes (Signed)
HPI: ?     Jaime Hernandez is a 26 y.o. G0P0000 who LMP was Patient's last menstrual period was 11/01/2021 (approximate). ? ?Subjective:  ? ?She presents today for follow-up of infertility because of PCO.  She has stopped OCPs late last month and is going to attempt pregnancy.  She continues on her metformin. ?She plans to get married next month!! ?She is not yet taking prenatal vitamins. ? ?  Hx: ?The following portions of the patient's history were reviewed and updated as appropriate: ?            She  has a past medical history of Obesity, PCOS (polycystic ovarian syndrome), and PCOS (polycystic ovarian syndrome). ?She does not have a problem list on file. ?She  has a past surgical history that includes Wisdom tooth extraction. ?Her family history includes Cancer (age of onset: 18) in her maternal uncle. ?She  reports that she has never smoked. She has never used smokeless tobacco. She reports that she does not currently use alcohol. She reports that she does not currently use drugs after having used the following drugs: Marijuana. ?She has a current medication list which includes the following prescription(s): albuterol, metformin, and multiple vitamins-minerals. ?She is allergic to acetaminophen-codeine. ?      ?Review of Systems:  ?Review of Systems ? ?Constitutional: Denied constitutional symptoms, night sweats, recent illness, fatigue, fever, insomnia and weight loss.  ?Eyes: Denied eye symptoms, eye pain, photophobia, vision change and visual disturbance.  ?Ears/Nose/Throat/Neck: Denied ear, nose, throat or neck symptoms, hearing loss, nasal discharge, sinus congestion and sore throat.  ?Cardiovascular: Denied cardiovascular symptoms, arrhythmia, chest pain/pressure, edema, exercise intolerance, orthopnea and palpitations.  ?Respiratory: Denied pulmonary symptoms, asthma, pleuritic pain, productive sputum, cough, dyspnea and wheezing.  ?Gastrointestinal: Denied, gastro-esophageal reflux, melena, nausea  and vomiting.  ?Genitourinary: Denied genitourinary symptoms including symptomatic vaginal discharge, pelvic relaxation issues, and urinary complaints.  ?Musculoskeletal: Denied musculoskeletal symptoms, stiffness, swelling, muscle weakness and myalgia.  ?Dermatologic: Denied dermatology symptoms, rash and scar.  ?Neurologic: Denied neurology symptoms, dizziness, headache, neck pain and syncope.  ?Psychiatric: Denied psychiatric symptoms, anxiety and depression.  ?Endocrine: Denied endocrine symptoms including hot flashes and night sweats.  ? ?Meds: ?  ?Current Outpatient Medications on File Prior to Visit  ?Medication Sig Dispense Refill  ? albuterol (PROVENTIL HFA) 108 (90 Base) MCG/ACT inhaler Inhale 2 puffs into the lungs every 4 (four) hours as needed for wheezing or shortness of breath. 1 each 0  ? metFORMIN (GLUCOPHAGE) 500 MG tablet Take 2 tablets (1,000 mg total) by mouth 2 (two) times daily with a meal. 500 at bedtime for 1 wk.  Then increase to twice per day with meals as directed. 100 tablet 2  ? Multiple Vitamins-Minerals (MULTIVITAL PO) Take by mouth.    ? ?No current facility-administered medications on file prior to visit.  ? ? ? ? ?Objective:  ?  ? ?Vitals:  ? 11/07/21 0955  ?BP: (!) 120/91  ?Pulse: (!) 114  ? ?Filed Weights  ? 11/07/21 0955  ?Weight: 235 lb 14.4 oz (107 kg)  ? ?  ?          ?        ? ?Assessment:  ?  ?G0P0000 ?There are no problems to display for this patient. ? ?  ?1. Pre-conception counseling   ? ? Patient desires fertility at this time. ? ? ?Plan:  ?  ?       ? 1.  To continue metformin ? 2.  Strategies of timing for intercourse discussed.  3 times per week versus cycle tracking versus ovulation predictor kits.  All methods discussed in detail.  Timing of intercourse discussed. ? 3.  As long as she remains ovulatory she may continue to attempt pregnancy.  If she becomes anovulatory would restart OCPs and consider Serophene/letrozole. ? 4.  Husband semen analysis if patient does  not become pregnant while ovulatory ? 5.  Patient to begin prenatal vitamins preconception. ?Orders ?No orders of the defined types were placed in this encounter. ? ? No orders of the defined types were placed in this encounter. ?  ?  F/U ? No follow-ups on file. ?I spent 22 minutes involved in the care of this patient preparing to see the patient by obtaining and reviewing her medical history (including labs, imaging tests and prior procedures), documenting clinical information in the electronic health record (EHR), counseling and coordinating care plans, writing and sending prescriptions, ordering tests or procedures and in direct communicating with the patient and medical staff discussing pertinent items from her history and physical exam. ? ?Elonda Husky, M.D. ?11/07/2021 ?10:17 AM ? ? ? ? ?

## 2021-11-07 NOTE — Progress Notes (Signed)
Patient presents today to discuss conception. She states she has been on OCP's for the last 5 years and would like to start planning for a pregnancy. Patient states concern regarding her history of PCOS and how this may impact her journey. Patient states no other questions or concerns at this time. ?

## 2021-11-24 ENCOUNTER — Emergency Department
Admission: EM | Admit: 2021-11-24 | Discharge: 2021-11-24 | Disposition: A | Discharge status: Home / Self Care | Payer: 59 | Attending: Emergency Medicine | Admitting: Emergency Medicine

## 2021-11-24 ENCOUNTER — Emergency Department: Payer: 59

## 2021-11-24 DIAGNOSIS — R102 Pelvic and perineal pain: Secondary | ICD-10-CM | POA: Diagnosis not present

## 2021-11-24 LAB — URINALYSIS, ROUTINE W REFLEX MICROSCOPIC
Bilirubin Urine: NEGATIVE
Glucose, UA: NEGATIVE mg/dL
Hgb urine dipstick: NEGATIVE
Ketones, ur: NEGATIVE mg/dL
Nitrite: NEGATIVE
Protein, ur: 30 mg/dL — AB
Specific Gravity, Urine: 1.021 (ref 1.005–1.030)
pH: 5 (ref 5.0–8.0)

## 2021-11-24 LAB — CBC WITH DIFFERENTIAL/PLATELET
Abs Immature Granulocytes: 0.04 10*3/uL (ref 0.00–0.07)
Basophils Absolute: 0.1 10*3/uL (ref 0.0–0.1)
Basophils Relative: 1 %
Eosinophils Absolute: 0.2 10*3/uL (ref 0.0–0.5)
Eosinophils Relative: 2 %
HCT: 41.1 % (ref 36.0–46.0)
Hemoglobin: 13.2 g/dL (ref 12.0–15.0)
Immature Granulocytes: 0 %
Lymphocytes Relative: 29 %
Lymphs Abs: 2.8 10*3/uL (ref 0.7–4.0)
MCH: 28.9 pg (ref 26.0–34.0)
MCHC: 32.1 g/dL (ref 30.0–36.0)
MCV: 90.1 fL (ref 80.0–100.0)
Monocytes Absolute: 0.5 10*3/uL (ref 0.1–1.0)
Monocytes Relative: 5 %
Neutro Abs: 6 10*3/uL (ref 1.7–7.7)
Neutrophils Relative %: 63 %
Platelets: 272 10*3/uL (ref 150–400)
RBC: 4.56 MIL/uL (ref 3.87–5.11)
RDW: 13.2 % (ref 11.5–15.5)
WBC: 9.6 10*3/uL (ref 4.0–10.5)
nRBC: 0 % (ref 0.0–0.2)

## 2021-11-24 LAB — COMPREHENSIVE METABOLIC PANEL
ALT: 21 U/L (ref 0–44)
AST: 16 U/L (ref 15–41)
Albumin: 3.7 g/dL (ref 3.5–5.0)
Alkaline Phosphatase: 82 U/L (ref 38–126)
Anion gap: 7 (ref 5–15)
BUN: 12 mg/dL (ref 6–20)
CO2: 26 mmol/L (ref 22–32)
Calcium: 9 mg/dL (ref 8.9–10.3)
Chloride: 105 mmol/L (ref 98–111)
Creatinine, Ser: 0.53 mg/dL (ref 0.44–1.00)
GFR, Estimated: >60 mL/min (ref >60)
Glucose, Bld: 89 mg/dL (ref 70–99)
Potassium: 3.9 mmol/L (ref 3.5–5.1)
Sodium: 138 mmol/L (ref 135–145)
Total Bilirubin: 0.5 mg/dL (ref 0.3–1.2)
Total Protein: 7 g/dL (ref 6.5–8.1)

## 2021-11-24 LAB — POC URINE PREG, ED: Preg Test, Ur: NEGATIVE

## 2021-11-24 NOTE — Discharge Instructions (Addendum)
You can take 650 mg of Tylenol alternating with you 600 mg of Ibuprofen.  ?

## 2021-11-24 NOTE — ED Provider Notes (Signed)
? ?[facility] ?Provider Note ? ?Patient Contact: 3:47 PM (approximate) ? ? ?History  ? ?Pelvic Pain ? ? ?HPI ? ?Jaime Hernandez is a 26 y.o. female with a history of ovarian cyst presents to the emergency department with midline suprapubic pain that is occurred for the past 2 to 3 days.  Patient denies dysuria, increased urinary frequency, abdominal pain or low back pain.  She states that her current pain feels different and ovarian cyst pain in the past.  Denies a history of ovarian torsion's.  Patient reports that she is trying to conceive and has had more sexual activity than normal.  She denies fever or chills at home.  No chest pain, chest tightness or shortness of breath. ? ?  ? ? ?Physical Exam  ? ?Triage Vital Signs: ?ED Triage Vitals [11/24/21 1512]  ?Enc Vitals Group  ?   BP (!) 130/96  ?   Pulse Rate 93  ?   Resp 18  ?   Temp 99 ?F (37.2 ?C)  ?   Temp Source Oral  ?   SpO2 95 %  ?   Weight 204 lb (92.5 kg)  ?   Height 5\' 5"  (1.651 m)  ?   Head Circumference   ?   Peak Flow   ?   Pain Score 6  ?   Pain Loc   ?   Pain Edu?   ?   Excl. in GC?   ? ? ?Most recent vital signs: ?Vitals:  ? 11/24/21 1512 11/24/21 1512  ?BP: (!) 130/96 (!) 130/96  ?Pulse: 93 90  ?Resp: 18 16  ?Temp: 99 ?F (37.2 ?C) 99 ?F (37.2 ?C)  ?SpO2: 95% 93%  ? ? ? ?General: Alert and in no acute distress. ?Eyes:  PERRL. EOMI. ?Head: No acute traumatic findings ?ENT: ?     Nose: No congestion/rhinnorhea. ?     Mouth/Throat: Mucous membranes are moist. ?Neck: No stridor. No cervical spine tenderness to palpation. ?Cardiovascular:  Good peripheral perfusion ?Respiratory: Normal respiratory effort without tachypnea or retractions. Lungs CTAB. Good air entry to the bases with no decreased or absent breath sounds. ?Gastrointestinal: Bowel sounds ?4 quadrants. Soft and nontender to palpation. No guarding or rigidity. No palpable masses. No distention. No CVA tenderness.  Patient does have suprapubic tenderness to  palpation. ?Musculoskeletal: Full range of motion to all extremities.  ?Neurologic:  No gross focal neurologic deficits are appreciated.  ?Skin:   No rash noted ?Other: ? ? ?ED Results / Procedures / Treatments  ? ?Labs ?(all labs ordered are listed, but only abnormal results are displayed) ?Labs Reviewed  ?URINALYSIS, ROUTINE W REFLEX MICROSCOPIC - Abnormal; Notable for the following components:  ?    Result Value  ? Color, Urine YELLOW (*)   ? APPearance CLOUDY (*)   ? Protein, ur 30 (*)   ? Leukocytes,Ua TRACE (*)   ? Bacteria, UA RARE (*)   ? All other components within normal limits  ?CBC WITH DIFFERENTIAL/PLATELET  ?COMPREHENSIVE METABOLIC PANEL  ?POC URINE PREG, ED  ? ? ? ? ? ?PROCEDURES: ? ?Critical Care performed: No ? ?Procedures ? ? ?MEDICATIONS ORDERED IN ED: ?Medications - No data to display ? ? ?IMPRESSION / MDM / ASSESSMENT AND PLAN / ED COURSE  ?I reviewed the triage vital signs and the nursing notes. ?             ?               ? ?  Assessment and plan: ?Pelvic pain: ?Differential diagnosis includes, but is not limited to, ovarian cyst, ovarian torsion, nonspecific pelvic pain,  ? ?26 year old female with history of ovarian cysts presents to the emergency department with suprapubic discomfort. ? ?Vital signs are reassuring at triage.  On physical exam, patient was alert, active and nontoxic-appearing but did have reproducible tenderness to palpation. ? ?CBC, CMP and urinalysis unremarkable.  Urine pregnancy test was negative.  Dedicated ultrasound with torsion rule out showed no acute abnormality.  Suspect discomfort secondary to increase sexual activity.  Recommended pelvic rest over the next 3 to 4 days with Tylenol and ibuprofen alternating for discomfort. ? ? ?FINAL CLINICAL IMPRESSION(S) / ED DIAGNOSES  ? ?Final diagnoses:  ?Pelvic pain in female  ? ? ? ?Rx / DC Orders  ? ?ED Discharge Orders   ? ? None  ? ?  ? ? ? ?Note:  This document was prepared using Dragon voice recognition software and  may include unintentional dictation errors. ?  ?Orvil Feil, PA-C ?11/24/21 1627 ? ?  ?Merwyn Katos, MD ?11/24/21 1638 ? ?

## 2021-11-24 NOTE — ED Triage Notes (Signed)
Patient to ER via POV with complaints of mid-pelvic pain that started on Monday, describes it as sharp and non-radiating. Reports hx of ovarian cysts but reports this feels different. Denies any irregular bleeding or cramping. Reports she has been trying to get pregnant. LMP 3/29.  ?

## 2022-02-27 DIAGNOSIS — N979 Female infertility, unspecified: Secondary | ICD-10-CM | POA: Diagnosis not present

## 2022-03-21 ENCOUNTER — Emergency Department: Payer: 59

## 2022-03-21 ENCOUNTER — Emergency Department
Admission: EM | Admit: 2022-03-21 | Discharge: 2022-03-21 | Disposition: A | Discharge status: Home / Self Care | Payer: 59 | Attending: Emergency Medicine | Admitting: Emergency Medicine

## 2022-03-21 ENCOUNTER — Other Ambulatory Visit: Payer: Self-pay

## 2022-03-21 ENCOUNTER — Encounter: Payer: Self-pay | Admitting: Emergency Medicine

## 2022-03-21 DIAGNOSIS — R051 Acute cough: Secondary | ICD-10-CM | POA: Diagnosis not present

## 2022-03-21 DIAGNOSIS — R000 Tachycardia, unspecified: Secondary | ICD-10-CM | POA: Insufficient documentation

## 2022-03-21 DIAGNOSIS — Z20822 Contact with and (suspected) exposure to covid-19: Secondary | ICD-10-CM | POA: Insufficient documentation

## 2022-03-21 DIAGNOSIS — J029 Acute pharyngitis, unspecified: Secondary | ICD-10-CM | POA: Diagnosis not present

## 2022-03-21 DIAGNOSIS — B349 Viral infection, unspecified: Secondary | ICD-10-CM | POA: Insufficient documentation

## 2022-03-21 DIAGNOSIS — D72829 Elevated white blood cell count, unspecified: Secondary | ICD-10-CM | POA: Diagnosis not present

## 2022-03-21 DIAGNOSIS — R059 Cough, unspecified: Secondary | ICD-10-CM | POA: Diagnosis not present

## 2022-03-21 LAB — RESP PANEL BY RT-PCR (FLU A&B, COVID) ARPGX2
Influenza A by PCR: NEGATIVE
Influenza B by PCR: NEGATIVE
SARS Coronavirus 2 by RT PCR: NEGATIVE

## 2022-03-21 LAB — LIPASE, BLOOD: Lipase: 24 U/L (ref 11–51)

## 2022-03-21 LAB — MONONUCLEOSIS SCREEN: Mono Screen: NEGATIVE

## 2022-03-21 LAB — CBC
HCT: 41.5 % (ref 36.0–46.0)
Hemoglobin: 13.4 g/dL (ref 12.0–15.0)
MCH: 28 pg (ref 26.0–34.0)
MCHC: 32.3 g/dL (ref 30.0–36.0)
MCV: 86.6 fL (ref 80.0–100.0)
Platelets: 277 10*3/uL (ref 150–400)
RBC: 4.79 MIL/uL (ref 3.87–5.11)
RDW: 13.9 % (ref 11.5–15.5)
WBC: 10.9 10*3/uL — ABNORMAL HIGH (ref 4.0–10.5)
nRBC: 0 % (ref 0.0–0.2)

## 2022-03-21 LAB — URINALYSIS, ROUTINE W REFLEX MICROSCOPIC
Bilirubin Urine: NEGATIVE
Glucose, UA: NEGATIVE mg/dL
Hgb urine dipstick: NEGATIVE
Ketones, ur: NEGATIVE mg/dL
Leukocytes,Ua: NEGATIVE
Nitrite: NEGATIVE
Protein, ur: NEGATIVE mg/dL
Specific Gravity, Urine: 1.015 (ref 1.005–1.030)
pH: 5 (ref 5.0–8.0)

## 2022-03-21 LAB — COMPREHENSIVE METABOLIC PANEL
ALT: 21 U/L (ref 0–44)
AST: 16 U/L (ref 15–41)
Albumin: 4.1 g/dL (ref 3.5–5.0)
Alkaline Phosphatase: 85 U/L (ref 38–126)
Anion gap: 8 (ref 5–15)
BUN: 12 mg/dL (ref 6–20)
CO2: 23 mmol/L (ref 22–32)
Calcium: 9 mg/dL (ref 8.9–10.3)
Chloride: 106 mmol/L (ref 98–111)
Creatinine, Ser: 0.7 mg/dL (ref 0.44–1.00)
GFR, Estimated: >60 mL/min (ref >60)
Glucose, Bld: 88 mg/dL (ref 70–99)
Potassium: 3.8 mmol/L (ref 3.5–5.1)
Sodium: 137 mmol/L (ref 135–145)
Total Bilirubin: 0.4 mg/dL (ref 0.3–1.2)
Total Protein: 7.8 g/dL (ref 6.5–8.1)

## 2022-03-21 LAB — GROUP A STREP BY PCR: Group A Strep by PCR: NOT DETECTED

## 2022-03-21 LAB — PROCALCITONIN: Procalcitonin: <0.1 ng/mL

## 2022-03-21 LAB — HCG, QUANTITATIVE, PREGNANCY: hCG, Beta Chain, Quant, S: <1 m[IU]/mL (ref <5)

## 2022-03-21 LAB — POC URINE PREG, ED: Preg Test, Ur: NEGATIVE

## 2022-03-21 MED ORDER — KETOROLAC TROMETHAMINE 15 MG/ML IJ SOLN
15.0000 mg | Freq: Once | INTRAMUSCULAR | Status: AC
  Administered 2022-03-21: 15 mg via INTRAVENOUS
  Filled 2022-03-21: qty 1

## 2022-03-21 MED ORDER — ACETAMINOPHEN 325 MG PO TABS
650.0000 mg | ORAL_TABLET | Freq: Once | ORAL | Status: AC
  Administered 2022-03-21: 650 mg via ORAL
  Filled 2022-03-21: qty 2

## 2022-03-21 MED ORDER — ONDANSETRON HCL 4 MG/2ML IJ SOLN
4.0000 mg | Freq: Once | INTRAMUSCULAR | Status: AC
  Administered 2022-03-21: 4 mg via INTRAVENOUS
  Filled 2022-03-21: qty 2

## 2022-03-21 MED ORDER — SODIUM CHLORIDE 0.9 % IV BOLUS
1000.0000 mL | Freq: Once | INTRAVENOUS | Status: AC
  Administered 2022-03-21: 1000 mL via INTRAVENOUS

## 2022-03-21 MED ORDER — PANTOPRAZOLE SODIUM 40 MG IV SOLR
40.0000 mg | Freq: Once | INTRAVENOUS | Status: AC
  Administered 2022-03-21: 40 mg via INTRAVENOUS
  Filled 2022-03-21: qty 10

## 2022-03-21 NOTE — ED Triage Notes (Signed)
Patient to ED for cough, sore throat, abd pain, and diarrhea. Patient states symptoms started yesterday.

## 2022-03-21 NOTE — ED Provider Notes (Signed)
[facility] Provider Note    Event Date/Time   First MD Initiated Contact with Patient 03/21/22 1537     (approximate)   History   Cough   HPI  Jaime Hernandez is a 26 y.o. female who comes in with cough sore throat abdominal pain, diarrhea, cough  Patient reports being vaccinated growing up.  She reports symptoms started yesterday but have been getting worse.  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  She does not wear a mask at work.  She denies any surgeries on her abdomen.  She does report history of PCOS.    Physical Exam   Triage Vital Signs: ED Triage Vitals  Enc Vitals Group     BP 03/21/22 1516 135/72     Pulse Rate 03/21/22 1516 (!) 130     Resp 03/21/22 1516 18     Temp 03/21/22 1516 99.5 F (37.5 C)     Temp Source 03/21/22 1516 Oral     SpO2 03/21/22 1516 99 %     Weight 03/21/22 1517 250 lb (113.4 kg)     Height 03/21/22 1517 5\' 5"  (1.651 m)     Head Circumference --      Peak Flow --      Pain Score 03/21/22 1516 6     Pain Loc --      Pain Edu? --      Excl. in GC? --     Most recent vital signs: Vitals:   03/21/22 1516  BP: 135/72  Pulse: (!) 130  Resp: 18  Temp: 99.5 F (37.5 C)  SpO2: 99%     General: Awake, no distress.  CV:  Good peripheral perfusion.  Resp:  Normal effort.  Abd:  No distention.  Soft and nontender Other:  Oral pharynx is clear without any exudates.  Uvula is midline   ED Results / Procedures / Treatments   Labs (all labs ordered are listed, but only abnormal results are displayed) Labs Reviewed  CBC - Abnormal; Notable for the following components:      Result Value   WBC 10.9 (*)    All other components within normal limits  LIPASE, BLOOD  COMPREHENSIVE METABOLIC PANEL  URINALYSIS, ROUTINE W REFLEX MICROSCOPIC  POC URINE PREG, ED     EKG  My interpretation of EKG:  Sinus tachycardia rate of 130 without  any ST elevation or T wave versions, normal intervals  RADIOLOGY I have reviewed the xray personally and interpreted and no evidence of any pneumonia   PROCEDURES:  Critical Care performed: No  Procedures   MEDICATIONS ORDERED IN ED: Medications  sodium chloride 0.9 % bolus 1,000 mL (has no administration in time range)  sodium chloride 0.9 % bolus 1,000 mL (1,000 mLs Intravenous New Bag/Given 03/21/22 1701)  acetaminophen (TYLENOL) tablet 650 mg (650 mg Oral Given 03/21/22 1700)  ketorolac (TORADOL) 15 MG/ML injection 15 mg (15 mg Intravenous Given 03/21/22 1702)  ondansetron (ZOFRAN) injection 4 mg (4 mg Intravenous Given 03/21/22 1701)  pantoprazole (PROTONIX) injection 40 mg (40 mg Intravenous Given 03/21/22 1701)     IMPRESSION / MDM / ASSESSMENT AND PLAN / ED COURSE  I reviewed the triage vital signs and the nursing notes.   Patient's presentation is most consistent with acute presentation with potential threat to life or bodily function.   Patient comes in tachycardic and febrile with multiple symptoms inspection  is high for viral illness such as COVID, flu however x-ray ordered evaluate for pneumonia.  Urine evaluate for UTI.  Lipase is normal pregnancy test was negative procalcitonin is negative mono was negative COVID flu are negative CMP negative CBC shows slight elevation UA without evidence of UTI.  Patient does report episode of vomiting.    She does report missing her period about a week ago and wanted to make sure she was not pregnant.  We will get blood hCG test    On reassessment patient reports feeling much better.  Her procalcitonin is negative so I do suspect this is more likely viral in nature given her multiple symptoms.  She reports having 5-6 episodes of diarrhea did have an episode of vomiting the coughing and the sore throat the mild headaches.  However patient reports feeling immensely better than when she first got here.  She has supple neck and full range  of motion.  She is not altered or confused.  I low suspicion for meningitis, bacteremia.  We considered the possibility of appendicitis given originally she had reported some abdominal pain in triage but this was more epigastric in nature when I went to reassess her multiple times she is denying any lower abdominal pain.  We discussed CT imaging but she is elected to want to hold off.  Patient was offered more fluid due to her being slightly tachycardic still but has been declining.  She reports that she has Pedialyte at home.  We discussed further monitoring in the ER versus going home and patient is electing to want to go home at this time.  We discussed return to the ER tomorrow if symptoms are worsening or she develops any abdominal pain and following up for recheck tomorrow with her PCP.  She expressed understanding but at this time would like to be discharged  FINAL CLINICAL IMPRESSION(S) / ED DIAGNOSES   Final diagnoses:  Acute cough  Viral illness     Rx / DC Orders   ED Discharge Orders     None        Note:  This document was prepared using Dragon voice recognition software and may include unintentional dictation errors.   Concha Se, MD 03/21/22 [phone]

## 2022-03-21 NOTE — Discharge Instructions (Addendum)
We discussed CT imaging but you had no abdominal pain I suspect this is more likely viral so you have elected to hold off on CD but you can follow-up with your primary care doctor for repeat abdominal exam or return to the ER if you develop worsening symptoms or any other concern.  You can take Tylenol 1 g every 8 hours and alternate with ibuprofen 600 every 6-8 hours to help with your fevers.  Continue to stay well-hydrated

## 2022-03-26 ENCOUNTER — Other Ambulatory Visit: Payer: Self-pay

## 2022-03-26 MED ORDER — MEDROXYPROGESTERONE ACETATE 10 MG PO TABS
ORAL_TABLET | ORAL | 0 refills | Status: DC
  Filled 2022-03-26: qty 10, 10d supply, fill #0

## 2022-03-27 ENCOUNTER — Other Ambulatory Visit: Payer: Self-pay

## 2022-03-29 IMAGING — CR DG CHEST 2V
1 series · 2 of 2 positions shown · non-contrast
Comparison: 09/11/2020

CLINICAL DATA: Cough and congestion for 1 week.  Headache.

EXAM:
CHEST - 2 VIEW

[Series 1: dg chest 2 view · 0.14mm/px · 2 of 2 slices shown]
[im 1/2]
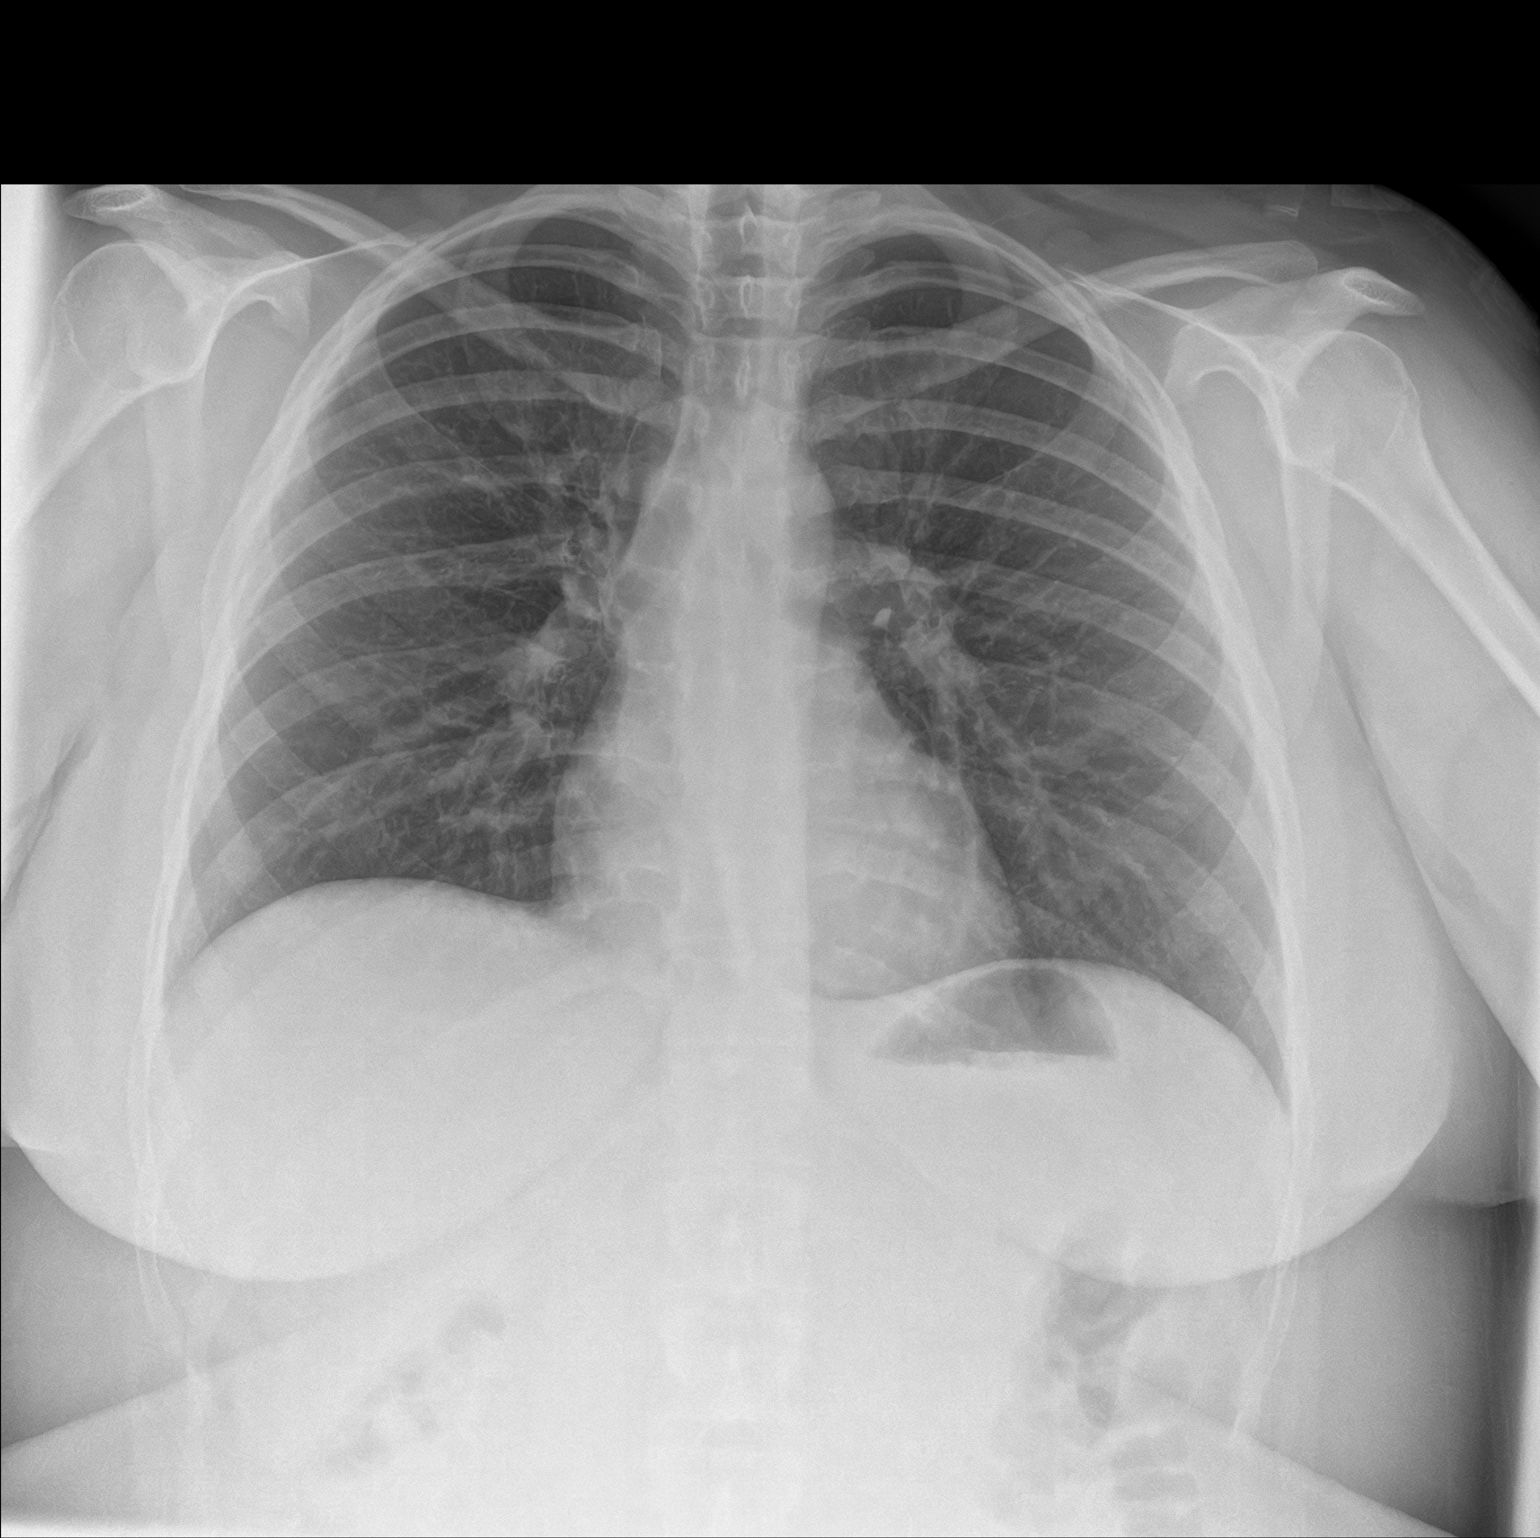
[im 2/2]
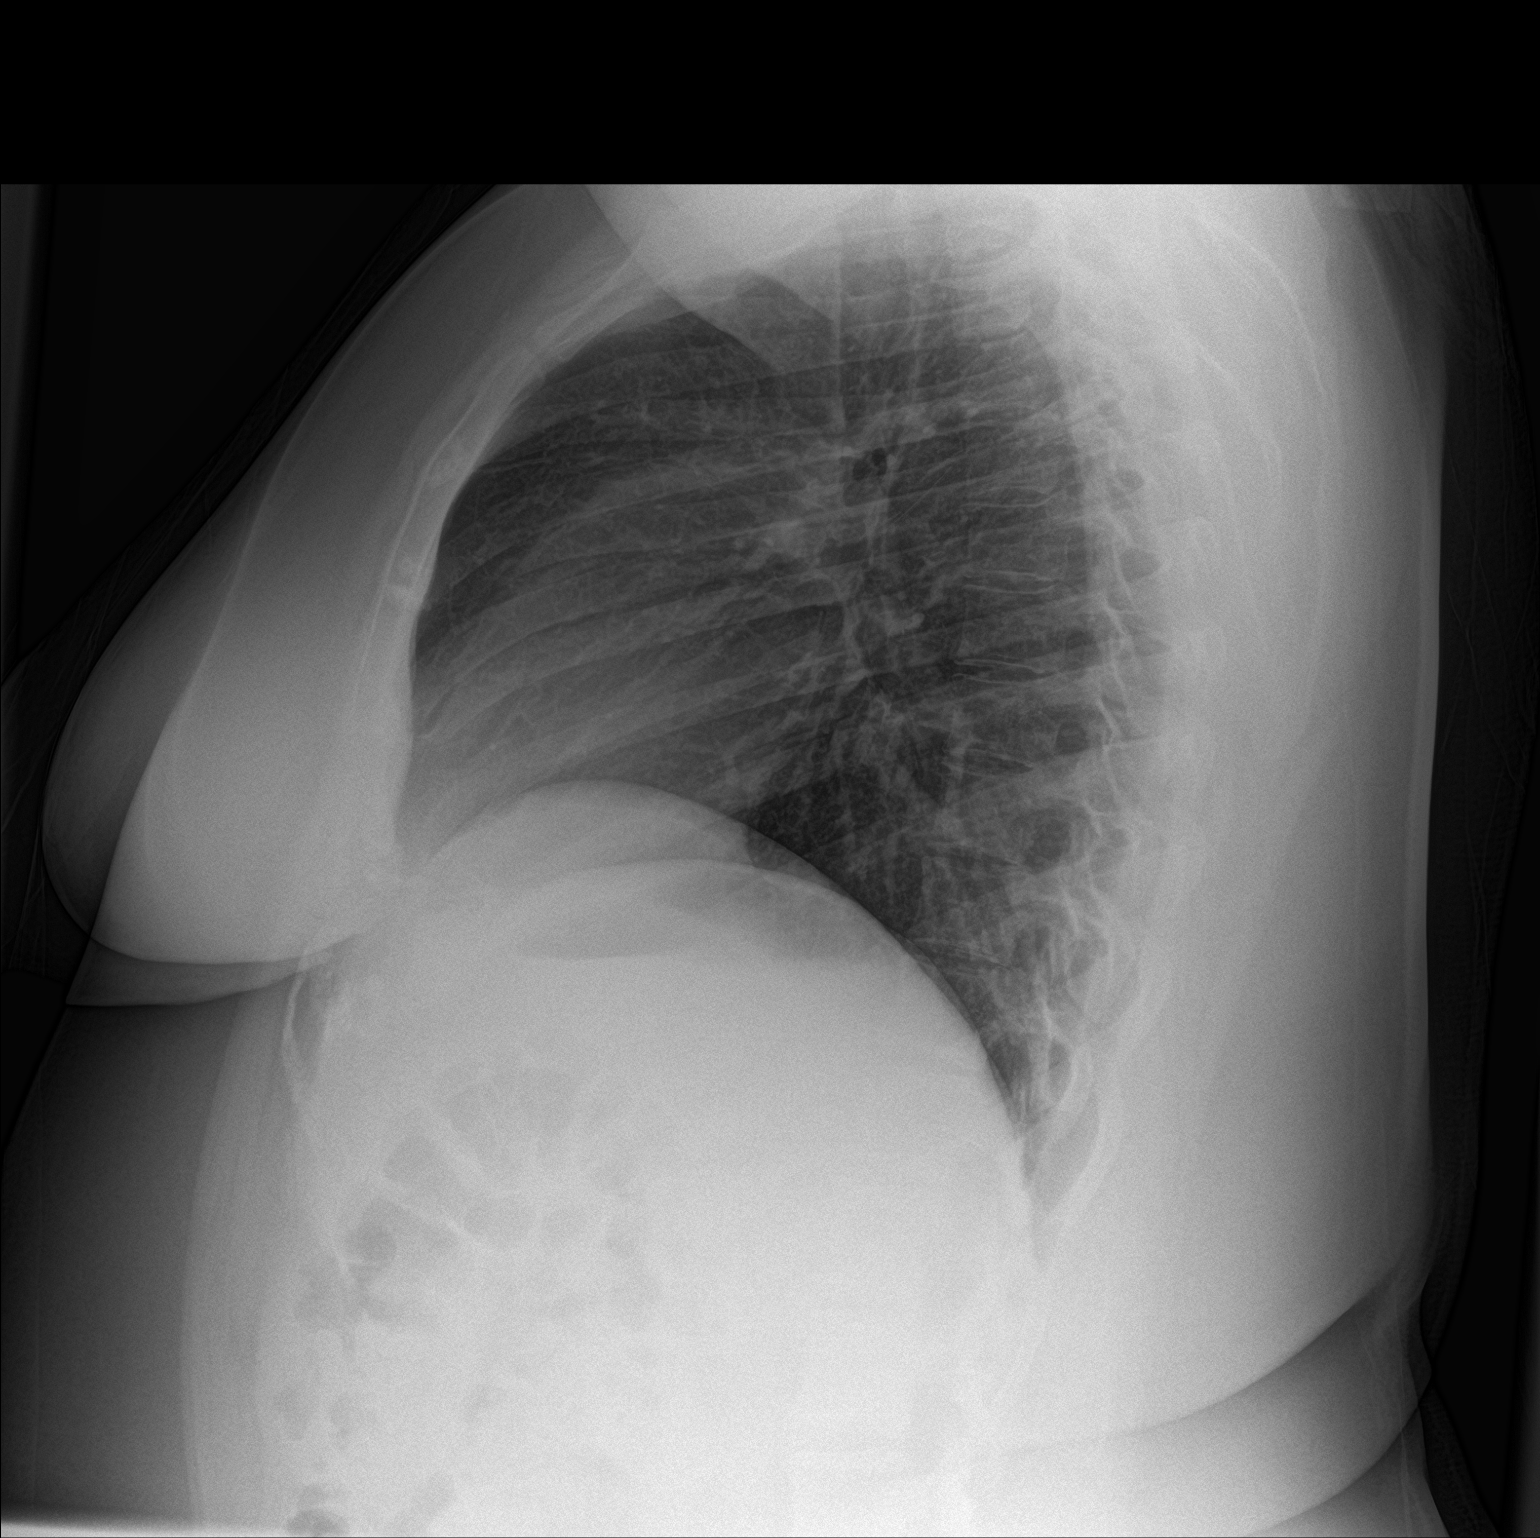

[2 of 2 positions shown; findings below may reference images not displayed]

FINDINGS: Heart size and pulmonary vascularity are normal. Lungs are clear. No
pleural effusions. No pneumothorax. Mediastinal contours appear
intact.
IMPRESSION: No active cardiopulmonary disease.

## 2022-04-16 ENCOUNTER — Other Ambulatory Visit: Payer: Self-pay

## 2022-04-16 MED ORDER — CLOMIPHENE CITRATE 50 MG PO TABS
ORAL_TABLET | ORAL | 0 refills | Status: DC
Start: 2022-04-16 — End: 2022-06-22
  Filled 2022-04-16 (×2): qty 5, 5d supply, fill #0

## 2022-05-04 DIAGNOSIS — N979 Female infertility, unspecified: Secondary | ICD-10-CM | POA: Diagnosis not present

## 2022-05-21 ENCOUNTER — Emergency Department: Payer: 59

## 2022-05-21 DIAGNOSIS — M7989 Other specified soft tissue disorders: Secondary | ICD-10-CM | POA: Diagnosis not present

## 2022-05-21 DIAGNOSIS — M79672 Pain in left foot: Secondary | ICD-10-CM | POA: Diagnosis not present

## 2022-05-21 DIAGNOSIS — M25572 Pain in left ankle and joints of left foot: Secondary | ICD-10-CM | POA: Diagnosis not present

## 2022-05-21 NOTE — ED Triage Notes (Signed)
Ambulatory to triage with Left Ankle pain x 1 month with " crunching sound."  Reports edema noted to foot on Saturday. +CMS noted, pt still able to bear weight on affected extremity.

## 2022-05-22 ENCOUNTER — Other Ambulatory Visit: Payer: Self-pay

## 2022-05-22 ENCOUNTER — Emergency Department
Admission: EM | Admit: 2022-05-22 | Discharge: 2022-05-22 | Disposition: A | Discharge status: Home / Self Care | Payer: 59 | Attending: Emergency Medicine | Admitting: Emergency Medicine

## 2022-05-22 DIAGNOSIS — M25572 Pain in left ankle and joints of left foot: Secondary | ICD-10-CM | POA: Diagnosis not present

## 2022-05-22 MED ORDER — IBUPROFEN 800 MG PO TABS
800.0000 mg | ORAL_TABLET | Freq: Three times a day (TID) | ORAL | 0 refills | Status: DC | PRN
  Filled 2022-05-22: qty 30, 10d supply, fill #0

## 2022-05-22 MED ORDER — IBUPROFEN 800 MG PO TABS
800.0000 mg | ORAL_TABLET | Freq: Once | ORAL | Status: AC
  Administered 2022-05-22: 800 mg via ORAL
  Filled 2022-05-22: qty 1

## 2022-05-22 NOTE — ED Notes (Signed)
E signature pad not available. Pt educated on discharge instructions and verbalized understanding.  

## 2022-05-22 NOTE — ED Provider Notes (Signed)
[facility] Provider Note    Event Date/Time   First MD Initiated Contact with Patient 05/22/22 0019     (approximate)   History   No chief complaint on file.   HPI  Jaime Hernandez is a 26 y.o. female with history of PCOS who presents to the emergency department with left ankle pain for the past month.  States that it is worse with standing.  No injury that she can recall.  No calf tenderness or calf swelling.  No numbness or weakness.  Has been applying an Ace wrap and using ice and elevation at home but not using any over-the-counter medications.  Patient works here in the hospital.   History provided by patient.    Past Medical History:  Diagnosis Date   Obesity    PCOS (polycystic ovarian syndrome)    PCOS (polycystic ovarian syndrome)     Past Surgical History:  Procedure Laterality Date   WISDOM TOOTH EXTRACTION      MEDICATIONS:  Prior to Admission medications   Medication Sig Start Date End Date Taking? Authorizing Provider  ibuprofen (ADVIL) 800 MG tablet Take 1 tablet (800 mg total) by mouth every 8 (eight) hours as needed. 05/22/22  Yes Nicholaus Steinke N, DO  albuterol (PROVENTIL HFA) 108 (90 Base) MCG/ACT inhaler Inhale 2 puffs into the lungs every 4 (four) hours as needed for wheezing or shortness of breath. 05/17/21   Carrie Mew, MD  clomiPHENE (CLOMID) 50 MG tablet Take 1 tablet (50 mg total) by mouth once daily for 5 days 04/16/22     medroxyPROGESTERone (PROVERA) 10 MG tablet Take 1 tablet (10 mg total) by mouth once daily for 10 days 03/26/22     metFORMIN (GLUCOPHAGE) 500 MG tablet Take 2 tablets (1,000 mg total) by mouth 2 (two) times daily with a meal. 500 at bedtime for 1 wk.  Then increase to twice per day with meals as directed. 07/11/21   Harlin Heys, MD  Multiple Vitamins-Minerals (MULTIVITAL PO) Take by mouth.    [provider]    Physical Exam   Triage Vital Signs: ED Triage Vitals  Enc Vitals  Group     BP 05/21/22 2332 (!) 140/81     Pulse Rate 05/21/22 2332 90     Resp --      Temp 05/21/22 2332 98.3 F (36.8 C)     Temp Source 05/21/22 2332 Oral     SpO2 05/21/22 2332 100 %     Weight 05/21/22 2331 250 lb (113.4 kg)     Height 05/21/22 2331 5\' 5"  (1.651 m)     Head Circumference --      Peak Flow --      Pain Score 05/21/22 2335 5     Pain Loc --      Pain Edu? --      Excl. in [place]? --     Most recent vital signs: Vitals:   05/21/22 2332  BP: (!) 140/81  Pulse: 90  Temp: 98.3 F (36.8 C)  SpO2: 100%     CONSTITUTIONAL: Alert and responds appropriately to questions. Well-appearing; well-nourished HEAD: Normocephalic, atraumatic EYES: Conjunctivae clear, pupils appear equal ENT: normal nose; moist mucous membranes NECK: Normal range of motion CARD: Regular rate and rhythm RESP: Normal chest excursion without splinting or tachypnea; no hypoxia or respiratory distress, speaking full sentences ABD/GI: non-distended EXT: Patient has some mild soft tissue swelling to the left ankle with tenderness over the medial  and lateral malleoli but no bony deformity, redness or warmth.  2+ left DP pulse.  No calf tenderness or calf swelling.  Compartments soft.  No joint effusion.  No ligamentous laxity. SKIN: Normal color for age and race, no rashes on exposed skin NEURO: Moves all extremities equally, normal speech, no facial asymmetry noted PSYCH: The patient's mood and manner are appropriate. Grooming and personal hygiene are appropriate.  ED Results / Procedures / Treatments   LABS: (all labs ordered are listed, but only abnormal results are displayed) Labs Reviewed - No data to display   EKG:  RADIOLOGY: My personal review and interpretation of imaging: X-ray of the left foot and ankle show soft tissue swelling but no bony abnormality.  I have personally reviewed all radiology reports. DG Ankle Complete Left  Result Date: 05/21/2022 CLINICAL DATA:  Ankle  pain. EXAM: LEFT ANKLE COMPLETE - 3+ VIEW COMPARISON:  None Available. FINDINGS: There is no evidence of fracture, dislocation, or joint effusion. There is no evidence of arthropathy or other focal bone abnormality. Soft tissue swelling about the lower extremity and ankle. IMPRESSION: 1. No acute fracture or dislocation. 2. Soft tissue swelling about the lower extremity and ankle. Electronically Signed   By: Larose Hires D.O.   On: 05/21/2022 23:56   DG Foot Complete Left  Result Date: 05/21/2022 CLINICAL DATA:  Foot and ankle pain EXAM: LEFT FOOT - COMPLETE 3+ VIEW COMPARISON:  None Available. FINDINGS: There is no evidence of fracture or dislocation. There is no evidence of arthropathy or other focal bone abnormality. Soft tissues are unremarkable. IMPRESSION: Negative. Electronically Signed   By: Jasmine Pang M.D.   On: 05/21/2022 23:55     PROCEDURES:  Critical Care performed: No     Procedures    IMPRESSION / MDM / ASSESSMENT AND PLAN / ED COURSE  I reviewed the triage vital signs and the nursing notes.   Patient here with left ankle pain for the past month.     DIFFERENTIAL DIAGNOSIS (includes but not limited to):   Tendinitis, sprain, doubt septic arthritis, gout, DVT, arterial obstruction, fracture  Patient's presentation is most consistent with acute complicated illness / injury requiring diagnostic workup.  PLAN: X-ray of the foot and ankle obtained from triage and reviewed/interpreted by myself and the radiologist and shows no acute bony abnormality.  Will place an Ace wrap.  Offered crutches but she declines.  Recommended Tylenol, Motrin for pain control, rest, elevation and ice.  Will provide with a work note for couple of days so she can keep her foot elevated.  Will give orthopedic follow-up if symptoms not improving.   MEDICATIONS GIVEN IN ED: Medications  ibuprofen (ADVIL) tablet 800 mg (has no administration in time range)     ED COURSE:  At this time, I do  not feel there is any life-threatening condition present. I reviewed all nursing notes, vitals, pertinent previous records.  All lab and urine results, EKGs, imaging ordered have been independently reviewed and interpreted by myself.  I reviewed all available radiology reports from any imaging ordered this visit.  Based on my assessment, I feel the patient is safe to be discharged home without further emergent workup and can continue workup as an outpatient as needed. Discussed all findings, treatment plan as well as usual and customary return precautions.  They verbalize understanding and are comfortable with this plan.  Outpatient follow-up has been provided as needed.  All questions have been answered.    CONSULTS: No  emergent orthopedic consult needed at this time.  Patient is appropriate for outpatient management.   OUTSIDE RECORDS REVIEWED: Reviewed patient's last OB/GYN note on 02/27/2022.     FINAL CLINICAL IMPRESSION(S) / ED DIAGNOSES   Final diagnoses:  Acute left ankle pain     Rx / DC Orders   ED Discharge Orders          Ordered    ibuprofen (ADVIL) 800 MG tablet  Every 8 hours PRN        05/22/22 0029             Note:  This document was prepared using Dragon voice recognition software and may include unintentional dictation errors.   Shenekia Riess, Layla Maw, DO 05/22/22 [phone]

## 2022-05-22 NOTE — Discharge Instructions (Addendum)
You may alternate Tylenol 1000 mg every 6 hours as needed for pain, fever and Ibuprofen 800 mg every 6-8 hours as needed for pain, fever.  Please take Ibuprofen with food.  Do not take more than 4000 mg of Tylenol (acetaminophen) in a 24 hour period. ° °

## 2022-05-25 ENCOUNTER — Other Ambulatory Visit: Payer: Self-pay

## 2022-05-25 MED ORDER — MEDROXYPROGESTERONE ACETATE 10 MG PO TABS
ORAL_TABLET | ORAL | 0 refills | Status: DC
Start: 2022-05-25 — End: 2022-08-02
  Filled 2022-05-25: qty 10, 10d supply, fill #0

## 2022-05-28 ENCOUNTER — Emergency Department: Payer: 59

## 2022-05-28 ENCOUNTER — Other Ambulatory Visit: Payer: Self-pay

## 2022-05-28 ENCOUNTER — Emergency Department
Admission: EM | Admit: 2022-05-28 | Discharge: 2022-05-28 | Disposition: A | Discharge status: Home / Self Care | Payer: 59 | Attending: Emergency Medicine | Admitting: Emergency Medicine

## 2022-05-28 DIAGNOSIS — R000 Tachycardia, unspecified: Secondary | ICD-10-CM | POA: Diagnosis not present

## 2022-05-28 DIAGNOSIS — R002 Palpitations: Secondary | ICD-10-CM | POA: Diagnosis not present

## 2022-05-28 DIAGNOSIS — M25572 Pain in left ankle and joints of left foot: Secondary | ICD-10-CM | POA: Diagnosis not present

## 2022-05-28 LAB — CBC
HCT: 40.6 % (ref 36.0–46.0)
Hemoglobin: 13.1 g/dL (ref 12.0–15.0)
MCH: 28.8 pg (ref 26.0–34.0)
MCHC: 32.3 g/dL (ref 30.0–36.0)
MCV: 89.2 fL (ref 80.0–100.0)
Platelets: 321 10*3/uL (ref 150–400)
RBC: 4.55 MIL/uL (ref 3.87–5.11)
RDW: 14 % (ref 11.5–15.5)
WBC: 12.4 10*3/uL — ABNORMAL HIGH (ref 4.0–10.5)
nRBC: 0 % (ref 0.0–0.2)

## 2022-05-28 LAB — URINALYSIS, ROUTINE W REFLEX MICROSCOPIC
Bilirubin Urine: NEGATIVE
Glucose, UA: NEGATIVE mg/dL
Hgb urine dipstick: NEGATIVE
Ketones, ur: 20 mg/dL — AB
Nitrite: NEGATIVE
Protein, ur: NEGATIVE mg/dL
Specific Gravity, Urine: 1.026 (ref 1.005–1.030)
pH: 5 (ref 5.0–8.0)

## 2022-05-28 LAB — BASIC METABOLIC PANEL
Anion gap: 5 (ref 5–15)
BUN: 12 mg/dL (ref 6–20)
CO2: 25 mmol/L (ref 22–32)
Calcium: 9.3 mg/dL (ref 8.9–10.3)
Chloride: 111 mmol/L (ref 98–111)
Creatinine, Ser: 0.73 mg/dL (ref 0.44–1.00)
GFR, Estimated: >60 mL/min (ref >60)
Glucose, Bld: 144 mg/dL — ABNORMAL HIGH (ref 70–99)
Potassium: 3.6 mmol/L (ref 3.5–5.1)
Sodium: 141 mmol/L (ref 135–145)

## 2022-05-28 LAB — POC URINE PREG, ED: Preg Test, Ur: NEGATIVE

## 2022-05-28 LAB — TROPONIN I (HIGH SENSITIVITY): Troponin I (High Sensitivity): 3 ng/L (ref <18)

## 2022-05-28 NOTE — ED Triage Notes (Signed)
Pt arrives to ED c/o racing heart. Pt states she can feel heart "pounding". States started 15mins ago and she felt like passing out. Denies CP, SOB.

## 2022-05-28 NOTE — ED Provider Notes (Signed)
[facility] Provider Note  Patient Contact: 10:28 PM (approximate)   History   No chief complaint on file.   HPI  Jaime Hernandez is a 26 y.o. female with a history of PCOS and palpitations, presents to the emergency department with a sensation of heart racing.  Patient states that she had been seen at [facility] clinic earlier in the day as she is seeking clearance to return to work for foot pain.  Patient states that she was returning home when she felt a sensation of heart racing and like she was going to pass out.  She denies chest pain, chest tightness or shortness of breath.  She states that she has had similar instances of discomfort in the past and was diagnosed with panic attacks.  She states that she is uncertain if this was a panic attack as she does not feel anxious.  No nausea, vomiting or abdominal pain.      Physical Exam   Triage Vital Signs: ED Triage Vitals [05/28/22 2015]  Enc Vitals Group     BP 125/82     Pulse Rate (!) 111     Resp 18     Temp 98.4 F (36.9 C)     Temp Source Oral     SpO2 99 %     Weight      Height      Head Circumference      Peak Flow      Pain Score 0     Pain Loc      Pain Edu?      Excl. in [place]?     Most recent vital signs: Vitals:   05/28/22 2015 05/28/22 2227  BP: 125/82 136/77  Pulse: (!) 111 98  Resp: 18 18  Temp: 98.4 F (36.9 C)   SpO2: 99% 99%     General: Alert and in no acute distress. Eyes:  PERRL. EOMI. Head: No acute traumatic findings ENT:      Nose: No congestion/rhinnorhea.      Mouth/Throat: Mucous membranes are moist.  Neck: No stridor. No cervical spine tenderness to palpation. Cardiovascular:  Good peripheral perfusion Respiratory: Normal respiratory effort without tachypnea or retractions. Lungs CTAB. Good air entry to the bases with no decreased or absent breath sounds. Gastrointestinal: Bowel sounds 4 quadrants. Soft and nontender to palpation. No guarding or rigidity.  No palpable masses. No distention. No CVA tenderness. Musculoskeletal: Full range of motion to all extremities.  Neurologic:  No gross focal neurologic deficits are appreciated.  Skin:   No rash noted    ED Results / Procedures / Treatments   Labs (all labs ordered are listed, but only abnormal results are displayed) Labs Reviewed  BASIC METABOLIC PANEL - Abnormal; Notable for the following components:      Result Value   Glucose, Bld 144 (*)    All other components within normal limits  CBC - Abnormal; Notable for the following components:   WBC 12.4 (*)    All other components within normal limits  URINALYSIS, ROUTINE W REFLEX MICROSCOPIC - Abnormal; Notable for the following components:   Color, Urine YELLOW (*)    APPearance HAZY (*)    Ketones, ur 20 (*)    Leukocytes,Ua TRACE (*)    Bacteria, UA RARE (*)    All other components within normal limits  POC URINE PREG, ED  TROPONIN I (HIGH SENSITIVITY)  TROPONIN I (HIGH SENSITIVITY)     EKG  Normal sinus  rhythm without ST segment elevation or other apparent arrhythmia   RADIOLOGY  I personally viewed and evaluated these images as part of my medical decision making, as well as reviewing the written report by the radiologist.  ED Provider Interpretation: No consolidations, opacities or infiltrates to suggest pneumonia.  No pneumothorax or pneumomediastinum.   PROCEDURES:  Critical Care performed: No  Procedures   MEDICATIONS ORDERED IN ED: Medications - No data to display   IMPRESSION / MDM / [place] / ED COURSE  I reviewed the triage vital signs and the nursing notes.                              Assessment and plan Palpitations 26 year old female presents to the emergency department with a sensation of heart racing that started on her way home from a doctor's appointment.  Vital signs are reassuring at triage.  On exam, patient seemed alert, active and nontoxic-appearing.  CBC, BMP and  troponin were within range.  Urine pregnancy test was negative.  Chest x-ray unremarkable.  Given that patient has been seen for similar complaints in the past, suspect anxiety.  We discussed stress management routine such as exercise, meditation, increasing intake of fluids during the day and sunlight exposure.  Return precautions were given to return with new or worsening symptoms.  All patient questions were answered.      FINAL CLINICAL IMPRESSION(S) / ED DIAGNOSES   Final diagnoses:  Palpitations     Rx / DC Orders   ED Discharge Orders     None        Note:  This document was prepared using Dragon voice recognition software and may include unintentional dictation errors.   Vallarie Mare [place], PA-C 05/28/22 2233    Naaman Plummer, MD 05/28/22 2329

## 2022-05-28 NOTE — Discharge Instructions (Signed)
Please make follow-up appointment with your primary care provider.

## 2022-06-05 ENCOUNTER — Encounter: Payer: Self-pay | Admitting: Podiatry

## 2022-06-05 ENCOUNTER — Ambulatory Visit (INDEPENDENT_AMBULATORY_CARE_PROVIDER_SITE_OTHER): Payer: 59 | Admitting: Podiatry

## 2022-06-05 DIAGNOSIS — Q666 Other congenital valgus deformities of feet: Secondary | ICD-10-CM | POA: Diagnosis not present

## 2022-06-05 DIAGNOSIS — M76822 Posterior tibial tendinitis, left leg: Secondary | ICD-10-CM | POA: Diagnosis not present

## 2022-06-12 NOTE — Progress Notes (Signed)
Subjective:  Patient ID: Jaime Hernandez, female    DOB: Jun 01, 1996,  MRN: [id number]  Chief Complaint  Patient presents with   Foot Pain    26 y.o. female presents with the above complaint.  Patient presents with mild posterior tibial tendinitis to the left side.  Patient had x-rays done at 05/21/2022 which did not show any fracture.  She went to get it evaluated she has not seen anyone else prior to seeing me hurts with ambulation worse with pressure she is flat-footed she does not wear any orthotics she is on her feet a lot.  She works at the [employer] system   Review of Systems: Negative except as noted in the HPI. Denies N/V/F/Ch.  Past Medical History:  Diagnosis Date   Obesity    PCOS (polycystic ovarian syndrome)    PCOS (polycystic ovarian syndrome)     Current Outpatient Medications:    albuterol (PROVENTIL HFA) 108 (90 Base) MCG/ACT inhaler, Inhale 2 puffs into the lungs every 4 (four) hours as needed for wheezing or shortness of breath., Disp: 1 each, Rfl: 0   clomiPHENE (CLOMID) 50 MG tablet, Take 1 tablet (50 mg total) by mouth once daily for 5 days, Disp: 5 tablet, Rfl: 0   ibuprofen (ADVIL) 800 MG tablet, Take 1 tablet (800 mg total) by mouth every 8 (eight) hours as needed., Disp: 30 tablet, Rfl: 0   medroxyPROGESTERone (PROVERA) 10 MG tablet, Take 1 tablet (10 mg total) by mouth once daily for 10 days, Disp: 10 tablet, Rfl: 0   metFORMIN (GLUCOPHAGE) 500 MG tablet, Take 2 tablets (1,000 mg total) by mouth 2 (two) times daily with a meal. 500 at bedtime for 1 wk.  Then increase to twice per day with meals as directed., Disp: 100 tablet, Rfl: 2   Multiple Vitamins-Minerals (MULTIVITAL PO), Take by mouth., Disp: , Rfl:   Social History   Tobacco Use  Smoking Status Never  Smokeless Tobacco Never    Allergies  Allergen Reactions   Acetaminophen-Codeine Other (See Comments)   Objective:  There were no vitals filed for this visit. There is no height or weight on  file to calculate BMI. Constitutional Well developed. Well nourished.  Vascular Dorsalis pedis pulses palpable bilaterally. Posterior tibial pulses palpable bilaterally. Capillary refill normal to all digits.  No cyanosis or clubbing noted. Pedal hair growth normal.  Neurologic Normal speech. Oriented to person, place, and time. Epicritic sensation to light touch grossly present bilaterally.  Dermatologic Nails well groomed and normal in appearance. No open wounds. No skin lesions.  Orthopedic: Pain on palpation along the course of posterior tibial tendon including the insertion.  No pain with dorsiflexion eversion of the foot.  No pain along the course of peroneal tendon Achilles tendon ATFL ligament.  Pain on palpation with resistance to plantarflexion inversion of the foot.   Radiographs: 1. No acute fracture or dislocation. 2. Soft tissue swelling about the lower extremity and ankle. Assessment:   1. Pes planovalgus   2. Posterior tibial tendinitis, left    Plan:  Patient was evaluated and treated and all questions answered.  Left posterior tibial tendinitis -All questions and concerns were discussed with the patient in extensive detail.  Given the amount of pain that she is having she will benefit from Tri-Lock ankle brace Tri-Lock ankle brace was dispensed.  At this time this is mild in nature and may be just aggravated without being supported by orthotics and good shoes.  I discussed shoe gear  modification extensive detail.  If it continues to get worse we will discuss cam boot immobilization  Pes planovalgus -I explained to patient the etiology of pes planovalgus and relationship with posterior tibial tendinitis and various treatment options were discussed.  Given patient foot structure in the setting of Planter fasciitis I believe patient will benefit from custom-made orthotics to help control the hindfoot motion support the arch of the foot and take the stress away from  plantar fascial.  Patient agrees with the plan like to proceed with orthotics -Patient was casted for orthotics   No follow-ups on file.

## 2022-06-19 ENCOUNTER — Emergency Department (HOSPITAL_COMMUNITY): Payer: 59

## 2022-06-19 ENCOUNTER — Other Ambulatory Visit: Payer: Self-pay

## 2022-06-19 ENCOUNTER — Emergency Department (HOSPITAL_COMMUNITY)
Admission: EM | Admit: 2022-06-19 | Discharge: 2022-06-19 | Discharge status: Against Medical Advice | Payer: 59 | Attending: Emergency Medicine | Admitting: Emergency Medicine

## 2022-06-19 DIAGNOSIS — R002 Palpitations: Secondary | ICD-10-CM | POA: Diagnosis not present

## 2022-06-19 DIAGNOSIS — R42 Dizziness and giddiness: Secondary | ICD-10-CM | POA: Insufficient documentation

## 2022-06-19 DIAGNOSIS — Z5321 Procedure and treatment not carried out due to patient leaving prior to being seen by health care provider: Secondary | ICD-10-CM | POA: Insufficient documentation

## 2022-06-19 LAB — T4, FREE: Free T4: 0.86 ng/dL (ref 0.61–1.12)

## 2022-06-19 LAB — COMPREHENSIVE METABOLIC PANEL
ALT: 18 U/L (ref 0–44)
AST: 16 U/L (ref 15–41)
Albumin: 3.6 g/dL (ref 3.5–5.0)
Alkaline Phosphatase: 74 U/L (ref 38–126)
Anion gap: 9 (ref 5–15)
BUN: 12 mg/dL (ref 6–20)
CO2: 24 mmol/L (ref 22–32)
Calcium: 9.2 mg/dL (ref 8.9–10.3)
Chloride: 107 mmol/L (ref 98–111)
Creatinine, Ser: 0.68 mg/dL (ref 0.44–1.00)
GFR, Estimated: >60 mL/min (ref >60)
Glucose, Bld: 96 mg/dL (ref 70–99)
Potassium: 3.9 mmol/L (ref 3.5–5.1)
Sodium: 140 mmol/L (ref 135–145)
Total Bilirubin: 0.4 mg/dL (ref 0.3–1.2)
Total Protein: 6.9 g/dL (ref 6.5–8.1)

## 2022-06-19 LAB — CBC WITH DIFFERENTIAL/PLATELET
Abs Immature Granulocytes: 0.03 10*3/uL (ref 0.00–0.07)
Basophils Absolute: 0.1 10*3/uL (ref 0.0–0.1)
Basophils Relative: 1 %
Eosinophils Absolute: 0.2 10*3/uL (ref 0.0–0.5)
Eosinophils Relative: 2 %
HCT: 40.8 % (ref 36.0–46.0)
Hemoglobin: 13.4 g/dL (ref 12.0–15.0)
Immature Granulocytes: 0 %
Lymphocytes Relative: 36 %
Lymphs Abs: 3.4 10*3/uL (ref 0.7–4.0)
MCH: 29.2 pg (ref 26.0–34.0)
MCHC: 32.8 g/dL (ref 30.0–36.0)
MCV: 88.9 fL (ref 80.0–100.0)
Monocytes Absolute: 0.6 10*3/uL (ref 0.1–1.0)
Monocytes Relative: 7 %
Neutro Abs: 5.2 10*3/uL (ref 1.7–7.7)
Neutrophils Relative %: 54 %
Platelets: 289 10*3/uL (ref 150–400)
RBC: 4.59 MIL/uL (ref 3.87–5.11)
RDW: 13.7 % (ref 11.5–15.5)
WBC: 9.5 10*3/uL (ref 4.0–10.5)
nRBC: 0 % (ref 0.0–0.2)

## 2022-06-19 LAB — URINALYSIS, ROUTINE W REFLEX MICROSCOPIC
Bilirubin Urine: NEGATIVE
Glucose, UA: NEGATIVE mg/dL
Hgb urine dipstick: NEGATIVE
Ketones, ur: 20 mg/dL — AB
Leukocytes,Ua: NEGATIVE
Nitrite: NEGATIVE
Protein, ur: NEGATIVE mg/dL
Specific Gravity, Urine: 1.021 (ref 1.005–1.030)
pH: 5 (ref 5.0–8.0)

## 2022-06-19 LAB — TSH: TSH: 2.251 u[IU]/mL (ref 0.350–4.500)

## 2022-06-19 LAB — I-STAT BETA HCG BLOOD, ED (MC, WL, AP ONLY): I-stat hCG, quantitative: <5 m[IU]/mL (ref <5)

## 2022-06-19 LAB — TROPONIN I (HIGH SENSITIVITY)
Troponin I (High Sensitivity): <2 ng/L (ref <18)
Troponin I (High Sensitivity): <2 ng/L (ref <18)

## 2022-06-19 NOTE — ED Triage Notes (Signed)
Pt. Stated, I've had dizziness with some heart palpitations for over a year. My Dr. Fredna Dow its anxiety.

## 2022-06-19 NOTE — ED Notes (Signed)
Pt not responding  to be roomed  

## 2022-06-19 NOTE — ED Provider Triage Note (Signed)
Emergency Medicine Provider Triage Evaluation Note  Jaime Hernandez , a 26 y.o. female  was evaluated in triage.  Pt complains of palpitations and dizziness over the past year. She reports that she has seen a cardiologist, PCP, and the ER about this problem before and it's either called anxiety or they don't know why. She reports she had a normal ECHO.  Review of Systems  Positive:  Negative:   Physical Exam  BP 129/73 (BP Location: Right Arm)   Pulse 82   Temp 99 F (37.2 C) (Oral)   Resp 16   Ht 5\' 5"  (1.651 m)   Wt 117.9 kg   LMP 04/20/2022 (Exact Date)   SpO2 99%   BMI 43.27 kg/m  Gen:   Awake, no distress   Resp:  Normal effort  MSK:   Moves extremities without difficulty  Other:  Cranial nerves grossly intact  Medical Decision Making  Medically screening exam initiated at 7:20 AM.  Appropriate orders placed.  Jaime Hernandez was informed that the remainder of the evaluation will be completed by another provider, this initial triage assessment does not replace that evaluation, and the importance of remaining in the ED until their evaluation is complete.  Labs and imaging ordered.    Imagene Sheller, Achille Rich 06/19/22 [phone]

## 2022-06-20 ENCOUNTER — Other Ambulatory Visit: Payer: Self-pay

## 2022-06-20 DIAGNOSIS — I499 Cardiac arrhythmia, unspecified: Secondary | ICD-10-CM | POA: Diagnosis not present

## 2022-06-20 DIAGNOSIS — R002 Palpitations: Secondary | ICD-10-CM | POA: Diagnosis not present

## 2022-06-20 MED ORDER — ATENOLOL 25 MG PO TABS
25.0000 mg | ORAL_TABLET | Freq: Every day | ORAL | 1 refills | Status: DC
  Filled 2022-06-20: qty 30, 30d supply, fill #0

## 2022-06-22 ENCOUNTER — Other Ambulatory Visit: Payer: Self-pay

## 2022-06-22 ENCOUNTER — Encounter: Payer: Self-pay | Admitting: Podiatry

## 2022-06-22 ENCOUNTER — Telehealth: Payer: Self-pay | Admitting: Podiatry

## 2022-06-22 MED ORDER — CLOMIPHENE CITRATE 50 MG PO TABS
ORAL_TABLET | ORAL | 0 refills | Status: DC
  Filled 2022-06-22: qty 10, 5d supply, fill #0

## 2022-06-22 NOTE — Telephone Encounter (Signed)
Patient called and stated her job is requesting a work note from when she was out for her foot. She states she needs the work note from 05/22/2022 until 06/05/2022. She stated they would not let her come to work with a boot on from the ED.  Please advise

## 2022-06-26 ENCOUNTER — Telehealth: Payer: Self-pay | Admitting: Podiatry

## 2022-06-26 NOTE — Telephone Encounter (Signed)
Lmom to let patient know her orthotics are in, no balance due. ([place] pt Ephriam Knuckles has a 12/1 schedule open in [place])

## 2022-07-06 ENCOUNTER — Other Ambulatory Visit: Payer: 59

## 2022-07-09 DIAGNOSIS — Z23 Encounter for immunization: Secondary | ICD-10-CM | POA: Diagnosis not present

## 2022-07-09 DIAGNOSIS — R002 Palpitations: Secondary | ICD-10-CM | POA: Diagnosis not present

## 2022-07-09 DIAGNOSIS — R42 Dizziness and giddiness: Secondary | ICD-10-CM | POA: Diagnosis not present

## 2022-07-17 DIAGNOSIS — N979 Female infertility, unspecified: Secondary | ICD-10-CM | POA: Diagnosis not present

## 2022-07-23 ENCOUNTER — Other Ambulatory Visit: Payer: Self-pay

## 2022-07-23 DIAGNOSIS — B9689 Other specified bacterial agents as the cause of diseases classified elsewhere: Secondary | ICD-10-CM | POA: Diagnosis not present

## 2022-07-23 DIAGNOSIS — Z03818 Encounter for observation for suspected exposure to other biological agents ruled out: Secondary | ICD-10-CM | POA: Diagnosis not present

## 2022-07-23 DIAGNOSIS — J208 Acute bronchitis due to other specified organisms: Secondary | ICD-10-CM | POA: Diagnosis not present

## 2022-07-23 MED ORDER — PROMETHAZINE-DM 6.25-15 MG/5ML PO SYRP
ORAL_SOLUTION | ORAL | 0 refills | Status: DC
  Filled 2022-07-23: qty 118, 6d supply, fill #0

## 2022-07-23 MED ORDER — CEFDINIR 300 MG PO CAPS
ORAL_CAPSULE | ORAL | 0 refills | Status: DC
  Filled 2022-07-23: qty 20, 10d supply, fill #0

## 2022-07-23 MED ORDER — ALBUTEROL SULFATE HFA 108 (90 BASE) MCG/ACT IN AERS
INHALATION_SPRAY | RESPIRATORY_TRACT | 0 refills | Status: DC
  Filled 2022-07-23: qty 6.7, 25d supply, fill #0

## 2022-07-24 ENCOUNTER — Ambulatory Visit: Payer: 59 | Admitting: Podiatry

## 2022-07-27 ENCOUNTER — Emergency Department
Admission: EM | Admit: 2022-07-27 | Discharge: 2022-07-27 | Disposition: A | Discharge status: Home / Self Care | Payer: 59 | Attending: Emergency Medicine | Admitting: Emergency Medicine

## 2022-07-27 ENCOUNTER — Emergency Department: Payer: 59

## 2022-07-27 DIAGNOSIS — J45909 Unspecified asthma, uncomplicated: Secondary | ICD-10-CM | POA: Diagnosis not present

## 2022-07-27 DIAGNOSIS — J40 Bronchitis, not specified as acute or chronic: Secondary | ICD-10-CM | POA: Diagnosis not present

## 2022-07-27 DIAGNOSIS — I10 Essential (primary) hypertension: Secondary | ICD-10-CM | POA: Diagnosis not present

## 2022-07-27 DIAGNOSIS — R42 Dizziness and giddiness: Secondary | ICD-10-CM | POA: Diagnosis not present

## 2022-07-27 DIAGNOSIS — R509 Fever, unspecified: Secondary | ICD-10-CM | POA: Diagnosis present

## 2022-07-27 DIAGNOSIS — R059 Cough, unspecified: Secondary | ICD-10-CM | POA: Diagnosis not present

## 2022-07-27 MED ORDER — DEXAMETHASONE SODIUM PHOSPHATE 10 MG/ML IJ SOLN
10.0000 mg | Freq: Once | INTRAMUSCULAR | Status: AC
  Administered 2022-07-27: 10 mg via INTRAMUSCULAR
  Filled 2022-07-27: qty 1

## 2022-07-27 MED ORDER — BENZONATATE 100 MG PO CAPS: 100.0000 mg | ORAL_CAPSULE | Freq: Three times a day (TID) | ORAL | 0 refills | Status: DC | PRN

## 2022-07-27 MED ORDER — PSEUDOEPH-BROMPHEN-DM 30-2-10 MG/5ML PO SYRP: 10.0000 mL | ORAL_SOLUTION | Freq: Four times a day (QID) | ORAL | 0 refills | Status: DC | PRN

## 2022-07-27 MED ORDER — IPRATROPIUM-ALBUTEROL 0.5-2.5 (3) MG/3ML IN SOLN
6.0000 mL | Freq: Once | RESPIRATORY_TRACT | Status: AC
  Administered 2022-07-27: 6 mL via RESPIRATORY_TRACT
  Filled 2022-07-27: qty 3

## 2022-07-27 MED ORDER — PREDNISONE 50 MG PO TABS: 50.0000 mg | ORAL_TABLET | Freq: Every day | ORAL | 0 refills | Status: DC

## 2022-07-27 NOTE — ED Triage Notes (Signed)
Pt presents to the ED via [facility] due to a cough and feeling weak. Pt states she was diagnosed with bronchitis pneumonia. Pt is taking medications but is not feeling better. Pt is still coughing. Pt A&Ox4

## 2022-07-27 NOTE — ED Triage Notes (Deleted)
Pt presents to the ED via [facility] due to a cough and feeling weak. Pt states she was diagnosed with bronchitis and pneumonia. Pt is taking medications but is not feeling better. Pt still has cough. Pt AxOx4

## 2022-07-27 NOTE — ED Provider Triage Note (Signed)
Emergency Medicine Provider Triage Evaluation Note  Shayann Garbutt, a 26 y.o. female  was evaluated in triage.  Pt complains of cough and weakness.  Currently on a course of cefdinir for recently diagnosed bronchitis and pneumonia. She called out to EMS after a coughing spell caused her to feel weak and dizzy. She denies fevers, SOB, or syncope.   Review of Systems  Positive: cough Negative: FCS  Physical Exam  There were no vitals taken for this visit. Gen:   Awake, no distress  NAD Resp:  Normal effort intermittent cough MSK:   Moves extremities without difficulty  Other:    Medical Decision Making  Medically screening exam initiated at 4:40 PM.  Appropriate orders placed.  Malaka Ruffner was informed that the remainder of the evaluation will be completed by another provider, this initial triage assessment does not replace that evaluation, and the importance of remaining in the ED until their evaluation is complete.  Patient to the ED for evaluation of ongoing nonproductive cough and weakness following a coughing spell.    Lissa Hoard, PA-C 07/27/22 1646

## 2022-07-27 NOTE — ED Provider Notes (Signed)
[facility] Provider Note  Patient Contact: 8:58 PM (approximate)   History   Cough   HPI  Jaime Hernandez is a 26 y.o. female who presents the emergency department 5 days of fevers, congestion, cough.  Patient states that she has a history of asthma, feels like a typical bronchitis for her.  She is been using her albuterol 1-2 times a day, went to urgent care and was diagnosed with pneumonia.  She states that they did not perform any chest x-ray, states that they just told her that she had pneumonia placed on antibiotics.  Patient been taking the antibiotic with no improvement of symptoms.  Patient feels like she may need steroids as this helped her in the past.  No GI complaints.  No chest pain.     Physical Exam   Triage Vital Signs: ED Triage Vitals  Enc Vitals Group     BP 07/27/22 1640 (!) 121/101     Pulse Rate 07/27/22 1640 (!) 106     Resp 07/27/22 1640 18     Temp 07/27/22 1640 98.6 F (37 C)     Temp Source 07/27/22 1640 Oral     SpO2 07/27/22 1640 97 %     Weight --      Height --      Head Circumference --      Peak Flow --      Pain Score 07/27/22 1641 0     Pain Loc --      Pain Edu? --      Excl. in [place]? --     Most recent vital signs: Vitals:   07/27/22 1640  BP: (!) 121/101  Pulse: (!) 106  Resp: 18  Temp: 98.6 F (37 C)  SpO2: 97%     General: Alert and in no acute distress. ENT:      Ears:       Nose: No congestion/rhinnorhea.      Mouth/Throat: Mucous membranes are moist. Neck: No stridor. No cervical spine tenderness to palpation  Cardiovascular:  Good peripheral perfusion Respiratory: Normal respiratory effort without tachypnea or retractions. Lungs with mild scattered expiratory wheezing.  No expiratory wheezing.  No rales or rhonchi.Peri Jefferson air entry to the bases with no decreased or absent breath sounds. Musculoskeletal: Full range of motion to all extremities.  Neurologic:  No gross focal neurologic deficits  are appreciated.  Skin:   No rash noted Other:   ED Results / Procedures / Treatments   Labs (all labs ordered are listed, but only abnormal results are displayed) Labs Reviewed - No data to display   EKG     RADIOLOGY  I personally viewed, evaluated, and interpreted these images as part of my medical decision making, as well as reviewing the written report by the radiologist.  ED Provider Interpretation: No acute cardiopulmonary findings on chest x-ray.  DG Chest 2 View  Result Date: 07/27/2022 CLINICAL DATA:  Cough, dizziness EXAM: CHEST - 2 VIEW COMPARISON:  06/19/2022 FINDINGS: The heart size and mediastinal contours are within normal limits. Both lungs are clear. The visualized skeletal structures are unremarkable. IMPRESSION: No active cardiopulmonary disease. Electronically Signed   By: Ernie Avena M.D.   On: 07/27/2022 17:09    PROCEDURES:  Critical Care performed: No  Procedures   MEDICATIONS ORDERED IN ED: Medications  dexamethasone (DECADRON) injection 10 mg (has no administration in time range)  ipratropium-albuterol (DUONEB) 0.5-2.5 (3) MG/3ML nebulizer solution 6 mL (has no administration  in time range)     IMPRESSION / MDM / ASSESSMENT AND PLAN / ED COURSE  I reviewed the triage vital signs and the nursing notes.                              Differential diagnosis includes, but is not limited to, bronchitis, pneumonia, flu, COVID, RSV  Patient's presentation is most consistent with acute presentation with potential threat to life or bodily function.   Patient's diagnosis is consistent with bronchitis.  Patient presents emergency department with a constant dry cough.  Was diagnosed with pneumonia at urgent care without chest x-ray.  Started on antibiotics.  No evidence of pneumonia on chest x-ray.  Based off of findings and symptoms I suspect bronchitis.  Patient will be given neb treatment and steroids here in the emergency department.  She  has albuterol at home and have instructed her to use this every 1-2 hours while awake coupled with prednisone for the next 5 days.  Prescribed symptom control medication with cough medications.  Return precautions discussed with the patient..  Patient is given ED precautions to return to the ED for any worsening or new symptoms.        FINAL CLINICAL IMPRESSION(S) / ED DIAGNOSES   Final diagnoses:  Bronchitis     Rx / DC Orders   ED Discharge Orders          Ordered    predniSONE (DELTASONE) 50 MG tablet  Daily with breakfast        07/27/22 2117    benzonatate (TESSALON PERLES) 100 MG capsule  3 times daily PRN        07/27/22 2117    brompheniramine-pseudoephedrine-DM 30-2-10 MG/5ML syrup  4 times daily PRN        07/27/22 2117             Note:  This document was prepared using Dragon voice recognition software and may include unintentional dictation errors.   Lanette Hampshire 07/27/22 2118    Phineas Semen, MD 07/27/22 2325

## 2022-08-02 ENCOUNTER — Other Ambulatory Visit: Payer: Self-pay

## 2022-08-02 MED ORDER — MEDROXYPROGESTERONE ACETATE 10 MG PO TABS
ORAL_TABLET | ORAL | 0 refills | Status: DC
  Filled 2022-08-02: qty 10, 10d supply, fill #0

## 2022-08-07 ENCOUNTER — Telehealth: Payer: Self-pay | Admitting: Podiatry

## 2022-08-07 NOTE — Telephone Encounter (Signed)
NUMBER not in service,  tried to reach pt to pick up orthotics.

## 2022-08-08 ENCOUNTER — Other Ambulatory Visit: Payer: Self-pay

## 2022-08-08 ENCOUNTER — Emergency Department
Admission: EM | Admit: 2022-08-08 | Discharge: 2022-08-08 | Disposition: A | Discharge status: Home / Self Care | Payer: 59 | Attending: Emergency Medicine | Admitting: Emergency Medicine

## 2022-08-08 DIAGNOSIS — R42 Dizziness and giddiness: Secondary | ICD-10-CM

## 2022-08-08 MED ORDER — MECLIZINE HCL 25 MG PO TABS
25.0000 mg | ORAL_TABLET | Freq: Three times a day (TID) | ORAL | 0 refills | Status: DC | PRN
  Filled 2022-08-08: qty 30, 10d supply, fill #0

## 2022-08-08 MED ORDER — MECLIZINE HCL 25 MG PO TABS
25.0000 mg | ORAL_TABLET | Freq: Once | ORAL | Status: AC
  Administered 2022-08-08: 25 mg via ORAL
  Filled 2022-08-08: qty 1

## 2022-08-08 NOTE — Discharge Instructions (Signed)
Follow-up with your regular doctor as needed.  Return if worsening.  Take medication as prescribed.  Drink plenty of water to stay hydrated to help prevent some dizziness.  Go from sitting to standing very slowly.

## 2022-08-08 NOTE — ED Triage Notes (Signed)
States woke up this morning feeling confused, dizzy.  Has had similar episodes in the past that have been attributed to anxiety. Also c/o left sided headache, left head numbness and ringing in left ear.  Patient is AAOx3.  Skin warm and dry. NAD.  Ambulates with easy and steady gait.  MAE equally and strong.

## 2022-08-08 NOTE — ED Notes (Signed)
Pt to ED stating that she would like to be seen for dizziness. Pt states that this morning she had slurred speech but that has since resolved. Pt is in NAD. Ambulatory without difficulty.

## 2022-08-08 NOTE — ED Provider Triage Note (Signed)
Emergency Medicine Provider Triage Evaluation Note  Jaime Hernandez , a 27 y.o. female  was evaluated in triage.  Pt complains of dizziness off and on for 1 year.  Started again recently.  No medication.   Review of Systems  Positive: Occasional nausea.  Negative:   Physical Exam  BP 123/87 (BP Location: Left Arm)   Pulse (!) 101   Temp 98.3 F (36.8 C) (Oral)   Resp 16   Ht 5\' 5"  (1.651 m)   Wt 117.9 kg   LMP 06/23/2022   SpO2 95%   BMI 43.25 kg/m  Gen:   Awake, no distress   Resp:  Normal effort  Lungs Clear MSK:   Moves extremities without difficulty  Other:    Medical Decision Making  Medically screening exam initiated at 2:48 PM.  Appropriate orders placed.  Jaime Hernandez was informed that the remainder of the evaluation will be completed by another provider, this initial triage assessment does not replace that evaluation, and the importance of remaining in the ED until their evaluation is complete.     Johnn Hai, PA-C 08/08/22 [phone]

## 2022-08-08 NOTE — ED Notes (Signed)
This RN reviewed paperwork with pt. No further complaints or questions. Pt ambulated to lobby. Signed discharge form placed in med rec box.  

## 2022-08-08 NOTE — ED Provider Notes (Signed)
[facility] Provider Note    Event Date/Time   First MD Initiated Contact with Patient 08/08/22 1541     (approximate)   History   No chief complaint on file.   HPI  Jaime Hernandez is a 27 y.o. female with no significant past medical history presents emergency department with complaints of dizziness.  Dizziness is worse with movement of her head and turning.  States also makes her have palpitations.  She states she has had the symptoms before and was told it was anxiety.  No fever or chills.  No chest pain/shortness of breath.      Physical Exam   Triage Vital Signs: ED Triage Vitals  Enc Vitals Group     BP 08/08/22 1443 123/87     Pulse Rate 08/08/22 1443 (!) 101     Resp 08/08/22 1443 16     Temp 08/08/22 1443 98.3 F (36.8 C)     Temp Source 08/08/22 1443 Oral     SpO2 08/08/22 1443 95 %     Weight 08/08/22 1441 259 lb 14.8 oz (117.9 kg)     Height 08/08/22 1441 5\' 5"  (1.651 m)     Head Circumference --      Peak Flow --      Pain Score 08/08/22 1441 0     Pain Loc --      Pain Edu? --      Excl. in [place]? --     Most recent vital signs: Vitals:   08/08/22 1443  BP: 123/87  Pulse: (!) 101  Resp: 16  Temp: 98.3 F (36.8 C)  SpO2: 95%     General: Awake, no distress.   CV:  Good peripheral perfusion. regular rate and  rhythm Resp:  Normal effort. Lungs cta Abd:  No distention.   Other:     ED Results / Procedures / Treatments   Labs (all labs ordered are listed, but only abnormal results are displayed) Labs Reviewed - No data to display   EKG     RADIOLOGY     PROCEDURES:   Procedures   MEDICATIONS ORDERED IN ED: Medications  meclizine (ANTIVERT) tablet 25 mg (25 mg Oral Given 08/08/22 1601)     IMPRESSION / MDM / [place] / ED COURSE  I reviewed the triage vital signs and the nursing notes.                              Differential diagnosis includes, but is not limited to, vertigo,  dizziness, Mnire's  Patient's presentation is most consistent with acute complicated illness / injury requiring diagnostic workup.   Patient was given meclizine while in the ED.  He is feeling better after the meclizine.  She is to follow-up with her cardiologist for her already scheduled appointment.  Follow-up with ENT for dizziness.  She was given a work note and discharged stable condition.  Strict instructions to return if worsening      FINAL CLINICAL IMPRESSION(S) / ED DIAGNOSES   Final diagnoses:  Vertigo     Rx / DC Orders   ED Discharge Orders          Ordered    meclizine (ANTIVERT) 25 MG tablet  3 times daily PRN        08/08/22 1644             Note:  This document was prepared using  Dragon Armed forces training and education officer and may include unintentional dictation errors.    Versie Starks, PA-C 08/08/22 1755    Harvest Dark, MD 08/08/22 2029

## 2022-08-09 ENCOUNTER — Other Ambulatory Visit: Payer: Self-pay

## 2022-08-10 ENCOUNTER — Other Ambulatory Visit: Payer: Self-pay

## 2022-08-10 ENCOUNTER — Emergency Department
Admission: EM | Admit: 2022-08-10 | Discharge: 2022-08-10 | Disposition: A | Discharge status: Home / Self Care | Payer: 59 | Attending: Emergency Medicine | Admitting: Emergency Medicine

## 2022-08-10 DIAGNOSIS — R42 Dizziness and giddiness: Secondary | ICD-10-CM | POA: Insufficient documentation

## 2022-08-10 LAB — CBC
HCT: 40.8 % (ref 36.0–46.0)
Hemoglobin: 13 g/dL (ref 12.0–15.0)
MCH: 29.3 pg (ref 26.0–34.0)
MCHC: 31.9 g/dL (ref 30.0–36.0)
MCV: 92.1 fL (ref 80.0–100.0)
Platelets: 276 10*3/uL (ref 150–400)
RBC: 4.43 MIL/uL (ref 3.87–5.11)
RDW: 13.9 % (ref 11.5–15.5)
WBC: 7.5 10*3/uL (ref 4.0–10.5)
nRBC: 0 % (ref 0.0–0.2)

## 2022-08-10 LAB — URINALYSIS, ROUTINE W REFLEX MICROSCOPIC
Bilirubin Urine: NEGATIVE
Glucose, UA: NEGATIVE mg/dL
Hgb urine dipstick: NEGATIVE
Ketones, ur: NEGATIVE mg/dL
Leukocytes,Ua: NEGATIVE
Nitrite: NEGATIVE
Protein, ur: NEGATIVE mg/dL
Specific Gravity, Urine: 1.027 (ref 1.005–1.030)
pH: 5 (ref 5.0–8.0)

## 2022-08-10 LAB — BASIC METABOLIC PANEL
Anion gap: 7 (ref 5–15)
BUN: 12 mg/dL (ref 6–20)
CO2: 25 mmol/L (ref 22–32)
Calcium: 8.4 mg/dL — ABNORMAL LOW (ref 8.9–10.3)
Chloride: 103 mmol/L (ref 98–111)
Creatinine, Ser: 0.7 mg/dL (ref 0.44–1.00)
GFR, Estimated: >60 mL/min (ref >60)
Glucose, Bld: 168 mg/dL — ABNORMAL HIGH (ref 70–99)
Potassium: 3.6 mmol/L (ref 3.5–5.1)
Sodium: 135 mmol/L (ref 135–145)

## 2022-08-10 LAB — POC URINE PREG, ED: Preg Test, Ur: NEGATIVE

## 2022-08-10 MED ORDER — MECLIZINE HCL 25 MG PO TABS
25.0000 mg | ORAL_TABLET | Freq: Three times a day (TID) | ORAL | 0 refills | Status: DC | PRN
  Filled 2022-08-10: qty 15, 5d supply, fill #0

## 2022-08-10 MED ORDER — MECLIZINE HCL 25 MG PO TABS: 25.0000 mg | ORAL_TABLET | Freq: Three times a day (TID) | ORAL | 0 refills | Status: DC | PRN

## 2022-08-10 MED ORDER — MECLIZINE HCL 25 MG PO TABS
50.0000 mg | ORAL_TABLET | Freq: Once | ORAL | Status: AC
  Administered 2022-08-10: 50 mg via ORAL
  Filled 2022-08-10: qty 2

## 2022-08-10 NOTE — ED Provider Notes (Signed)
[facility] Provider Note   Event Date/Time   First MD Initiated Contact with Patient 08/10/22 1749     (approximate) History  Dizziness  HPI Jaime Hernandez is a 27 y.o. female with a stated past medical history of vertigo who presents for dizziness that began approximately 4 hours prior to arrival.  Patient states that she was attempting to go to work today when she felt as though the room was moving around her as if she was "on a ship".  Patient states this is somewhat similar to vertiginous symptoms that she has had in the past however her home dose of 25 mg meclizine did not improve her symptoms. ROS: Patient currently denies any vision changes, tinnitus, difficulty speaking, facial droop, sore throat, chest pain, shortness of breath, abdominal pain, nausea/vomiting/diarrhea, dysuria, or weakness/numbness/paresthesias in any extremity   Physical Exam  Triage Vital Signs: ED Triage Vitals [08/10/22 1613]  Enc Vitals Group     BP 122/85     Pulse Rate 92     Resp 16     Temp 98.3 F (36.8 C)     Temp Source Oral     SpO2 99 %     Weight      Height      Head Circumference      Peak Flow      Pain Score 5     Pain Loc      Pain Edu?      Excl. in [place]?    Most recent vital signs: Vitals:   08/10/22 1613 08/10/22 1930  BP: 122/85 122/88  Pulse: 92 88  Resp: 16 18  Temp: 98.3 F (36.8 C) 98.2 F (36.8 C)  SpO2: 99% 100%   General: Awake, oriented x4. CV:  Good peripheral perfusion.  Resp:  Normal effort.  Abd:  No distention.  Other:  Young adult Caucasian overweight female laying in bed in no acute distress.  [place] exam positive for peripheral vertigo ED Results / Procedures / Treatments  Labs (all labs ordered are listed, but only abnormal results are displayed) Labs Reviewed  BASIC METABOLIC PANEL - Abnormal; Notable for the following components:      Result Value   Glucose, Bld 168 (*)    Calcium 8.4 (*)    All other components within  normal limits  URINALYSIS, ROUTINE W REFLEX MICROSCOPIC - Abnormal; Notable for the following components:   Color, Urine YELLOW (*)    APPearance HAZY (*)    All other components within normal limits  CBC  POC URINE PREG, ED   PROCEDURES: Critical Care performed: No Procedures MEDICATIONS ORDERED IN ED: Medications  meclizine (ANTIVERT) tablet 50 mg (50 mg Oral Given 08/10/22 1821)   IMPRESSION / MDM / ASSESSMENT AND PLAN / ED COURSE  I reviewed the triage vital signs and the nursing notes.                              Patient's presentation is most consistent with acute presentation with potential threat to life or bodily function. Based on History, Exam, and Findings, presentation not consistent with syncope, seizure, stroke, meningitis, symptomatic anemia (gastrointestinal bleed), Increased ICP (cerebral tumor/mass), ICH. Additionally, I have a low suspicion for AOM, labyrinthitis, or other infectious process. Tx: meclizine Reassessment: Prior to discharge symptoms controlled, patient well appearing. Disposition:  Discharge. Strict return precautions discussed w/ full understanding. Advise follow up with primary care  provider within 24-48 hours.   FINAL CLINICAL IMPRESSION(S) / ED DIAGNOSES   Final diagnoses:  Vertigo   Rx / DC Orders   ED Discharge Orders          Ordered    meclizine (ANTIVERT) 25 MG tablet  3 times daily PRN,   Status:  Discontinued        08/10/22 1900    meclizine (ANTIVERT) 25 MG tablet  3 times daily PRN        08/10/22 1921           Note:  This document was prepared using Dragon voice recognition software and may include unintentional dictation errors.   Naaman Plummer, MD 08/10/22 2127

## 2022-08-10 NOTE — ED Triage Notes (Signed)
Pt presents to ED with a possible "vertigo" attack. Pt states she was seen Wednesday for the same. Pt states she came to work and could not work today due to dizziness. Pt states sharp pain to L side of head. Pt denies injury or trauma.

## 2022-08-13 ENCOUNTER — Other Ambulatory Visit: Payer: Self-pay

## 2022-08-17 ENCOUNTER — Other Ambulatory Visit: Payer: Self-pay

## 2022-08-21 DIAGNOSIS — R002 Palpitations: Secondary | ICD-10-CM | POA: Diagnosis not present

## 2022-08-24 DIAGNOSIS — R002 Palpitations: Secondary | ICD-10-CM | POA: Diagnosis not present

## 2022-08-29 ENCOUNTER — Other Ambulatory Visit: Payer: Self-pay

## 2022-08-29 MED ORDER — CLOMIPHENE CITRATE 50 MG PO TABS
ORAL_TABLET | ORAL | 0 refills | Status: DC
  Filled 2022-08-29: qty 15, 5d supply, fill #0

## 2022-08-30 ENCOUNTER — Other Ambulatory Visit: Payer: Self-pay

## 2022-09-10 ENCOUNTER — Encounter: Payer: Self-pay | Admitting: Emergency Medicine

## 2022-09-10 DIAGNOSIS — H539 Unspecified visual disturbance: Secondary | ICD-10-CM | POA: Diagnosis not present

## 2022-09-10 DIAGNOSIS — H538 Other visual disturbances: Secondary | ICD-10-CM | POA: Diagnosis not present

## 2022-09-10 NOTE — ED Triage Notes (Signed)
Pt presents via POV with complaints of vision changes since starting Clomid; she notes having the episode 4 days ago but has resolved. Pt informed her PCP who advised that she come to the ED for evaluation. Denies double vision, N/V, headache, CP or SOB.

## 2022-09-11 ENCOUNTER — Emergency Department: Payer: 59

## 2022-09-11 ENCOUNTER — Emergency Department
Admission: EM | Admit: 2022-09-11 | Discharge: 2022-09-11 | Disposition: A | Discharge status: Home / Self Care | Payer: 59 | Attending: Emergency Medicine | Admitting: Emergency Medicine

## 2022-09-11 ENCOUNTER — Encounter: Payer: Self-pay | Admitting: Emergency Medicine

## 2022-09-11 DIAGNOSIS — H539 Unspecified visual disturbance: Secondary | ICD-10-CM

## 2022-09-11 LAB — BASIC METABOLIC PANEL
Anion gap: 8 (ref 5–15)
BUN: 16 mg/dL (ref 6–20)
CO2: 24 mmol/L (ref 22–32)
Calcium: 8.9 mg/dL (ref 8.9–10.3)
Chloride: 106 mmol/L (ref 98–111)
Creatinine, Ser: 0.71 mg/dL (ref 0.44–1.00)
GFR, Estimated: >60 mL/min (ref >60)
Glucose, Bld: 121 mg/dL — ABNORMAL HIGH (ref 70–99)
Potassium: 3.5 mmol/L (ref 3.5–5.1)
Sodium: 138 mmol/L (ref 135–145)

## 2022-09-11 LAB — CBC WITH DIFFERENTIAL/PLATELET
Abs Immature Granulocytes: 0.04 10*3/uL (ref 0.00–0.07)
Basophils Absolute: 0.1 10*3/uL (ref 0.0–0.1)
Basophils Relative: 1 %
Eosinophils Absolute: 0.1 10*3/uL (ref 0.0–0.5)
Eosinophils Relative: 1 %
HCT: 41.9 % (ref 36.0–46.0)
Hemoglobin: 13.6 g/dL (ref 12.0–15.0)
Immature Granulocytes: 0 %
Lymphocytes Relative: 32 %
Lymphs Abs: 4 10*3/uL (ref 0.7–4.0)
MCH: 29 pg (ref 26.0–34.0)
MCHC: 32.5 g/dL (ref 30.0–36.0)
MCV: 89.3 fL (ref 80.0–100.0)
Monocytes Absolute: 0.5 10*3/uL (ref 0.1–1.0)
Monocytes Relative: 4 %
Neutro Abs: 7.9 10*3/uL — ABNORMAL HIGH (ref 1.7–7.7)
Neutrophils Relative %: 62 %
Platelets: 322 10*3/uL (ref 150–400)
RBC: 4.69 MIL/uL (ref 3.87–5.11)
RDW: 13.6 % (ref 11.5–15.5)
WBC: 12.7 10*3/uL — ABNORMAL HIGH (ref 4.0–10.5)
nRBC: 0 % (ref 0.0–0.2)

## 2022-09-11 LAB — POC URINE PREG, ED: Preg Test, Ur: NEGATIVE

## 2022-09-11 MED ORDER — GADOBUTROL 1 MMOL/ML IV SOLN
10.0000 mL | Freq: Once | INTRAVENOUS | Status: AC | PRN
  Administered 2022-09-11: 10 mL via INTRAVENOUS

## 2022-09-11 NOTE — ED Notes (Signed)
Pt returned from MRI.  Vital signs obtained.  Pt resting comfortably in bed and denies other needs at this time.

## 2022-09-11 NOTE — ED Notes (Signed)
Pt dressed into hospital gown, personal items removed.

## 2022-09-11 NOTE — ED Provider Notes (Signed)
[facility] Provider Note    Event Date/Time   First MD Initiated Contact with Patient 09/11/22 0100     (approximate)   History   Eye Problem   HPI  Jaime Hernandez is a 27 y.o. female history of polycystic ovarian syndrome   Patient reports on Thursday she noticed strange visual symptoms almost like a weird sensation like her vision was slowed or lagging in both eyes.  She reports this in the setting of recently starting Clomid which she has since discontinued.  The symptoms were most prominent on Thursday and have since gone away.  She spoke to neurologist who recommended she come to the ER to get evaluated make sure she did not have a "brain lesion", but they did not see her in clinic  No fevers no chills no chest pain she denies having a headache.  She does get headaches from time to time but not currently having a headache.  Her vision has resumed its to be normal now  Physical Exam   Triage Vital Signs: ED Triage Vitals  Enc Vitals Group     BP 09/10/22 2311 136/84     Pulse Rate 09/10/22 2311 92     Resp 09/10/22 2311 18     Temp 09/10/22 2311 97.7 F (36.5 C)     Temp Source 09/10/22 2311 Oral     SpO2 09/10/22 2311 99 %     Weight 09/10/22 2310 250 lb (113.4 kg)     Height 09/10/22 2310 5\' 5"  (1.651 m)     Head Circumference --      Peak Flow --      Pain Score 09/10/22 2311 0     Pain Loc --      Pain Edu? --      Excl. in [place]? --     Most recent vital signs: Vitals:   09/10/22 2311 09/11/22 0301  BP: 136/84 130/76  Pulse: 92 90  Resp: 18 16  Temp: 97.7 F (36.5 C) 98.4 F (36.9 C)  SpO2: 99% 99%     General: Awake, no distress.  CV:  Good peripheral perfusion.  Very pleasant.  She is in no distress.  Resting comfortably Resp:  Normal effort.  Abd:  No distention.  Other:  No pronator drift in any extremity.  Walks with normal and stable gait.  Extraocular movements are normal.  Fully alert and oriented.  Currently denies  active symptoms or headache.  She is very pleasant no slurred speech cranial nerves grossly normal    Visual Acuity  Right Eye Distance: 20/25 Left Eye Distance: 20/20 Bilateral Distance: 20/20  Right Eye Near:   Left Eye Near:    Bilateral Near:      ED Results / Procedures / Treatments   Labs (all labs ordered are listed, but only abnormal results are displayed) Labs Reviewed  CBC WITH DIFFERENTIAL/PLATELET - Abnormal; Notable for the following components:      Result Value   WBC 12.7 (*)    Neutro Abs 7.9 (*)    All other components within normal limits  BASIC METABOLIC PANEL - Abnormal; Notable for the following components:   Glucose, Bld 121 (*)    All other components within normal limits  POC URINE PREG, ED     RADIOLOGY  MR BRAIN WO CONTRAST  Result Date: 09/11/2022 CLINICAL DATA:  Initial evaluation for visual changes. EXAM: MR VENOGRAM HEAD WITHOUT AND WITH CONTRAST TECHNIQUE: Angiographic images of the  intracranial venous structures were acquired using MRV technique without and with intravenous contrast. CONTRAST:  21mL GADAVIST GADOBUTROL 1 MMOL/ML IV SOLN COMPARISON:  Prior CT from 06/19/2022. FINDINGS: MRI HEAD FINDINGS Brain: Cerebral volume within normal limits for age. No focal parenchymal signal abnormality. No abnormal foci of restricted diffusion to suggest acute or subacute ischemia. Gray-white matter differentiation well maintained. No encephalomalacia to suggest chronic cortical infarction or other insult. No foci of susceptibility artifact indicative of acute or chronic intracranial blood products. No mass lesion, midline shift or mass effect. Ventricles normal in size and morphology without hydrocephalus. No extra-axial fluid collection. Pituitary gland and suprasellar region within normal limits. Vascular: Major intracranial vascular flow voids are well maintained. Skull and upper cervical spine: Craniocervical junction within normal limits. Diffuse loss of  normal bone marrow signal, nonspecific but can be seen with anemia, smoking, obesity, and infiltrative/myelofibrotic marrow processes. No focal marrow replacing lesion. No scalp soft tissue abnormality. Sinuses/Orbits: Globes and orbital soft tissues are within normal limits. Mild scattered mucosal thickening present about the ethmoidal air cells and maxillary sinuses. Paranasal sinuses are otherwise clear. No mastoid effusion. Other: None. MRV HEAD FINDINGS Normal flow related signal and enhancement seen throughout the superior sagittal sinus to the torcula. Transverse and sigmoid sinuses are patent as are the jugular bulbs and visualized proximal internal jugular veins. Left transverse sinus dominant. Straight sinus, vein of Galen, and internal cerebral veins are patent. No visible cortical vein abnormality. No other pathologic enhancement within the brain. IMPRESSION: 1. Normal brain MRI. No acute intracranial abnormality or findings to explain patient's symptoms. 2. Normal intracranial MRV. No evidence for dural sinus thrombosis. Electronically Signed   By: Jeannine Boga M.D.   On: 09/11/2022 04:06   MR MRV HEAD W WO CONTRAST  Result Date: 09/11/2022 CLINICAL DATA:  Initial evaluation for visual changes. EXAM: MR VENOGRAM HEAD WITHOUT AND WITH CONTRAST TECHNIQUE: Angiographic images of the intracranial venous structures were acquired using MRV technique without and with intravenous contrast. CONTRAST:  47mL GADAVIST GADOBUTROL 1 MMOL/ML IV SOLN COMPARISON:  Prior CT from 06/19/2022. FINDINGS: MRI HEAD FINDINGS Brain: Cerebral volume within normal limits for age. No focal parenchymal signal abnormality. No abnormal foci of restricted diffusion to suggest acute or subacute ischemia. Gray-white matter differentiation well maintained. No encephalomalacia to suggest chronic cortical infarction or other insult. No foci of susceptibility artifact indicative of acute or chronic intracranial blood products. No  mass lesion, midline shift or mass effect. Ventricles normal in size and morphology without hydrocephalus. No extra-axial fluid collection. Pituitary gland and suprasellar region within normal limits. Vascular: Major intracranial vascular flow voids are well maintained. Skull and upper cervical spine: Craniocervical junction within normal limits. Diffuse loss of normal bone marrow signal, nonspecific but can be seen with anemia, smoking, obesity, and infiltrative/myelofibrotic marrow processes. No focal marrow replacing lesion. No scalp soft tissue abnormality. Sinuses/Orbits: Globes and orbital soft tissues are within normal limits. Mild scattered mucosal thickening present about the ethmoidal air cells and maxillary sinuses. Paranasal sinuses are otherwise clear. No mastoid effusion. Other: None. MRV HEAD FINDINGS Normal flow related signal and enhancement seen throughout the superior sagittal sinus to the torcula. Transverse and sigmoid sinuses are patent as are the jugular bulbs and visualized proximal internal jugular veins. Left transverse sinus dominant. Straight sinus, vein of Galen, and internal cerebral veins are patent. No visible cortical vein abnormality. No other pathologic enhancement within the brain. IMPRESSION: 1. Normal brain MRI. No acute intracranial abnormality or findings to  explain patient's symptoms. 2. Normal intracranial MRV. No evidence for dural sinus thrombosis. Electronically Signed   By: Jeannine Boga M.D.   On: 09/11/2022 04:06    MRI brain normal.  Radiologist reviewed MRV and reports no acute    Given the patient's use of Clomid and visual changes I have ordered an MRI and MRV to exclude acute thrombosis or acute central neurologic causation for her visual changes.  The fact that her symptoms have resolved and she is not experiencing an active headache are quite reassuring at this time but given the use of Clomid it seems prudent to exclude venous sinus thrombosis in  particular.  She does have a family history of multiple sclerosis, but only in her grandfather.  She does not have any active symptoms of neurologic disease at this time  PROCEDURES:  Critical Care performed: No  Procedures   MEDICATIONS ORDERED IN ED: Medications  gadobutrol (GADAVIST) 1 MMOL/ML injection 10 mL (10 mLs Intravenous Contrast Given 09/11/22 0250)     IMPRESSION / MDM / ASSESSMENT AND PLAN / ED COURSE  I reviewed the triage vital signs and the nursing notes.                              Differential diagnosis includes, but is not limited to, possible side effect of Clomid, rule out venous sinus thrombosis, stroke, or central neurologic causation.  No ocular symptoms no eye pain.  Visual acuity is normal now and she reports resolution of vision change.  She has since discontinued Clomid and will discussed with Dr. Glennon Mac her OB/GYN  Her symptoms seem to be related to the initiation of Clomid.  Given this and its increased potential risk for venous cyanosis MRI MRV was obtained which is normal.  The fact that the patient is now asymptomatic with reassuring workup is quite reassuring.  MRV has helped exclude venous sinus thrombosis and she has a normal MRI of the brain.  She has no and ongoing ocular symptoms  Patient's presentation is most consistent with acute complicated illness / injury requiring diagnostic workup.      Clinical Course as of 09/11/22 0414  Tue Sep 11, 2022  0330 Resting comfortably.  Currently asymptomatic without concerns.  Awaiting MRI result [MQ]    Clinical Course User Index [MQ] Delman Kitten, MD   Return precautions and treatment recommendations and follow-up discussed with the patient who is agreeable with the plan.   FINAL CLINICAL IMPRESSION(S) / ED DIAGNOSES   Final diagnoses:  Visual changes     Rx / DC Orders   ED Discharge Orders     None        Note:  This document was prepared using Dragon voice recognition  software and may include unintentional dictation errors.   Delman Kitten, MD 09/11/22 [id number]

## 2022-09-11 NOTE — ED Notes (Signed)
Patient transported to MRI 

## 2022-09-11 NOTE — ED Notes (Addendum)
See triage note.  Pt was c/o vision changes several days ago, reports vision has since resolved.  Pt A&Ox4, chest rise even and unlabored, skin WNL and in NAD at this time.

## 2022-09-11 NOTE — Discharge Instructions (Addendum)
Please discontinue your use of Clomid.  We are suspicious this may have led to your symptoms.  Please call Dr. Marisue [place] office later today to discuss this recommendation to stop Clomid and set up follow-up with him please.

## 2022-09-18 DIAGNOSIS — N979 Female infertility, unspecified: Secondary | ICD-10-CM | POA: Diagnosis not present

## 2022-10-01 ENCOUNTER — Other Ambulatory Visit: Payer: Self-pay

## 2022-10-01 MED ORDER — MEDROXYPROGESTERONE ACETATE 10 MG PO TABS
10.0000 mg | ORAL_TABLET | Freq: Every day | ORAL | 0 refills | Status: DC
  Filled 2022-10-01: qty 10, 10d supply, fill #0

## 2022-10-06 IMAGING — US US PELVIS COMPLETE TRANSABD/TRANSVAG W DUPLEX AND/OR DOPPLER
1 series · 15 of 25 positions shown · non-contrast
Comparison: CT of the abdomen pelvis on 07/27/2020

CLINICAL DATA: Pelvic pain.  LMP 11/01/2021

EXAM:
TRANSABDOMINAL AND TRANSVAGINAL ULTRASOUND OF PELVIS
DOPPLER ULTRASOUND OF OVARIES
TECHNIQUE: Both transabdominal and transvaginal ultrasound examinations of the
pelvis were performed. Transabdominal technique was performed for
global imaging of the pelvis including uterus, ovaries, adnexal
regions, and pelvic cul-de-sac.
It was necessary to proceed with endovaginal exam following the
transabdominal exam to visualize the RIGHT lobe. Color and duplex
Doppler ultrasound was utilized to evaluate blood flow to the
ovaries.

[Series 1: us pelvis complete · 15 of 61 slices shown]
[im 1/61]
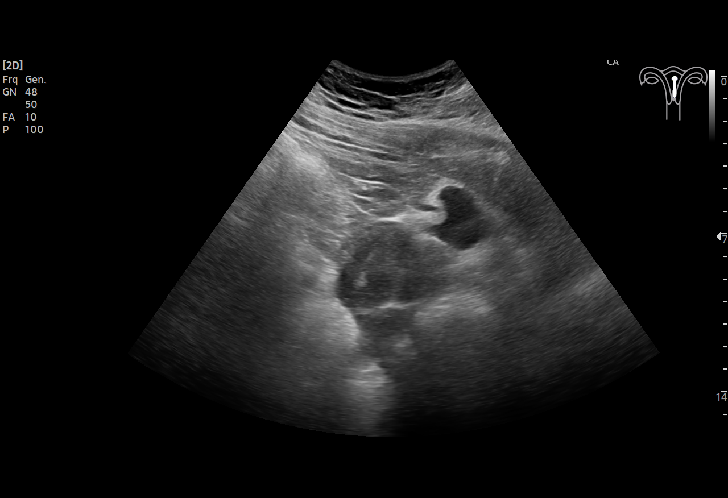
[im 6/61]
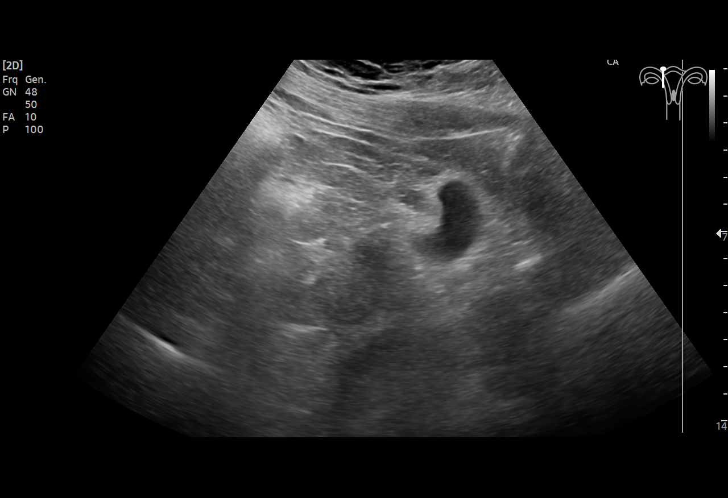
[im 11/61]
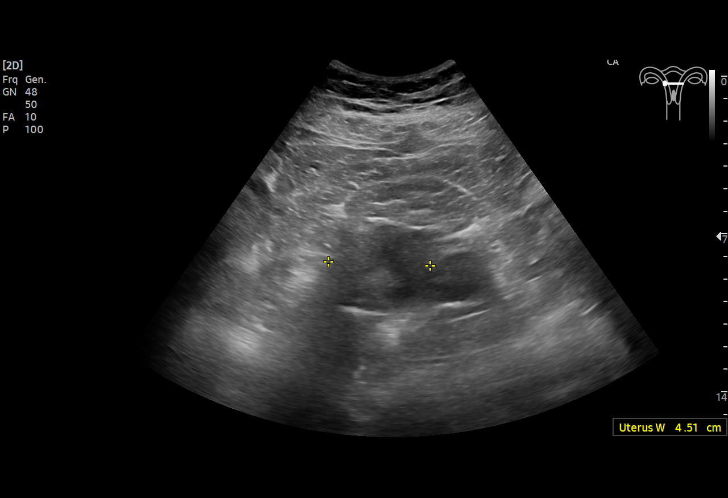
[im 13/61]
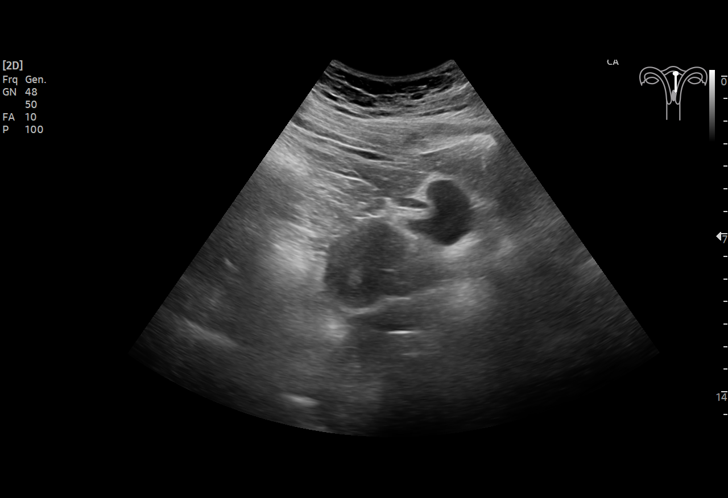
[im 18/61]
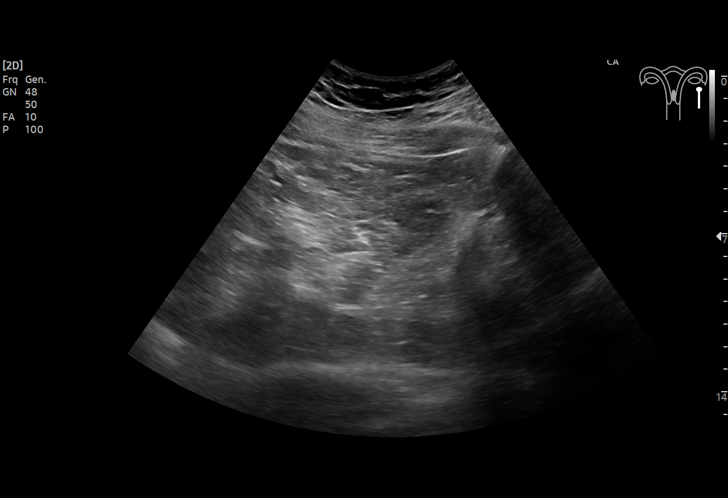
[im 23/61]
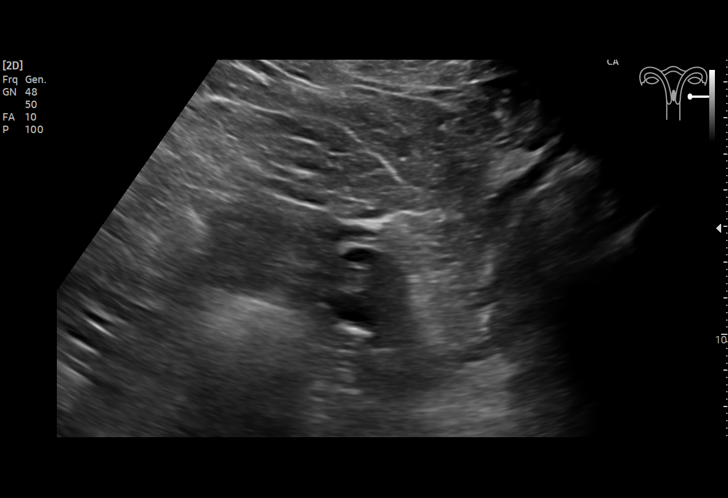
[im 26/61]
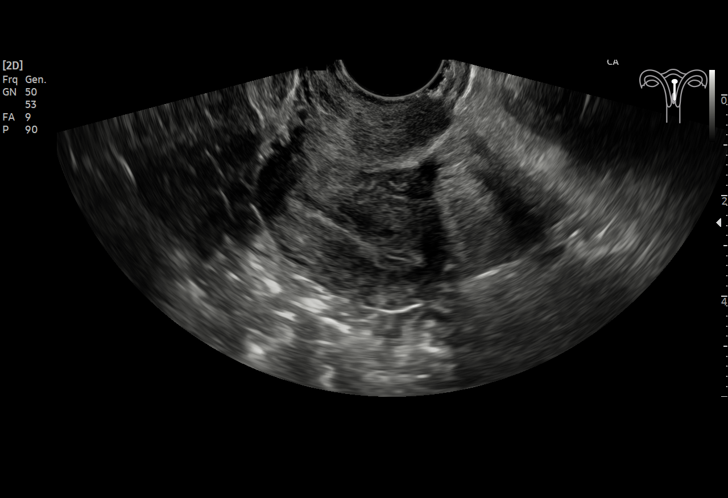
[im 31/61]
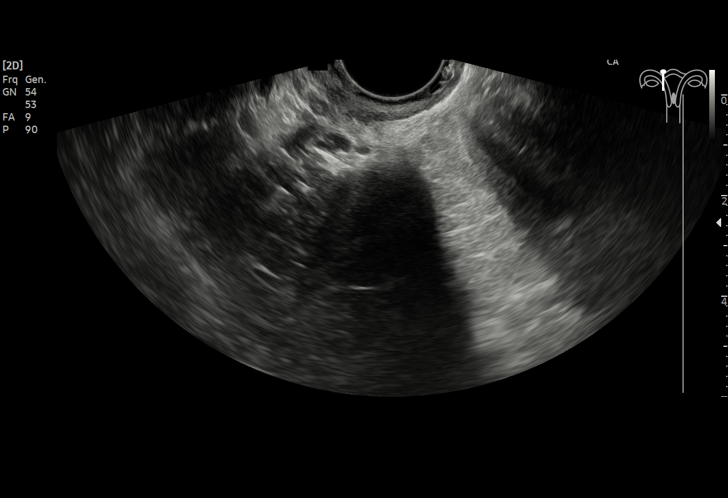
[im 36/61]
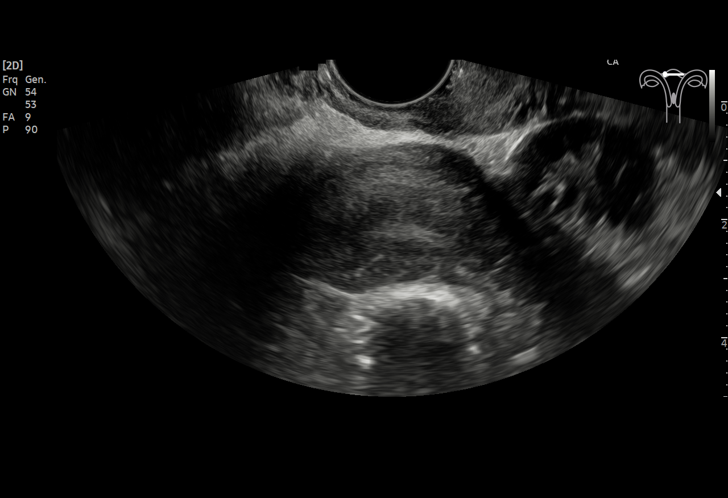
[im 38/61]
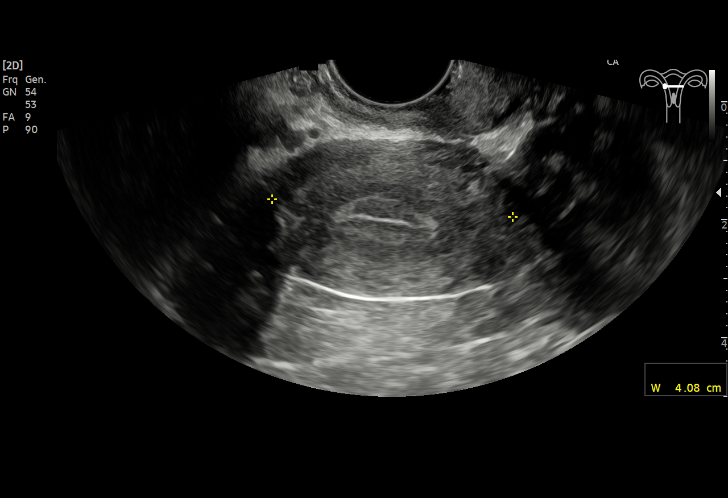
[im 43/61]
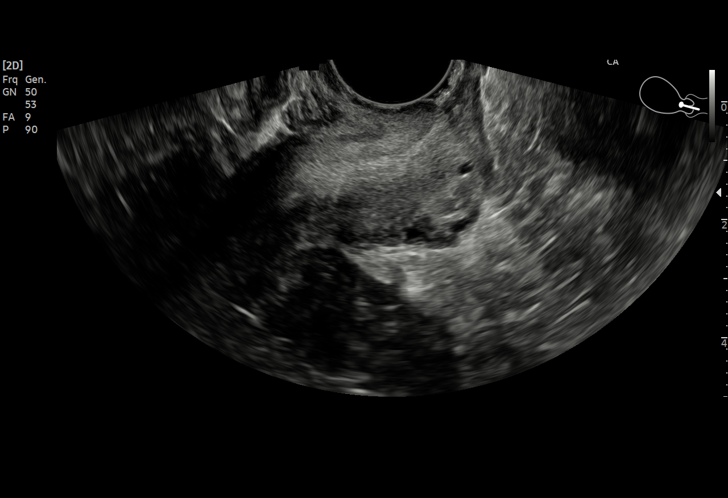
[im 48/61]
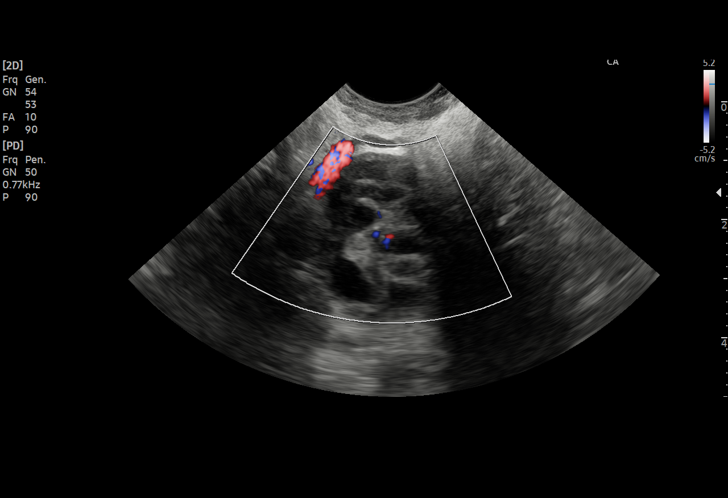
[im 51/61]
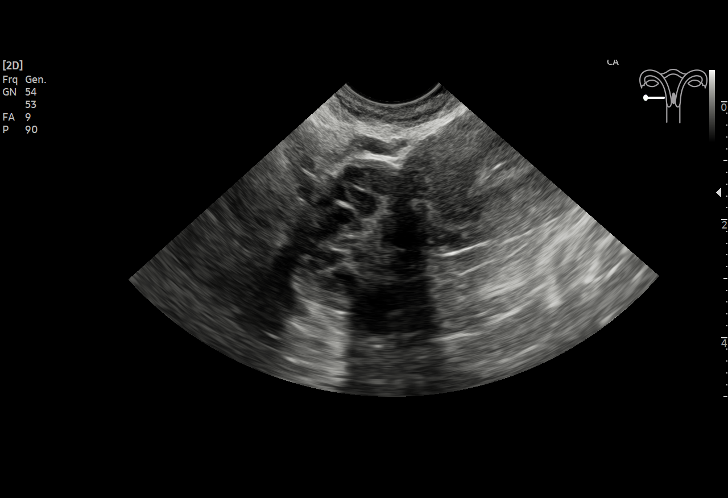
[im 56/61]
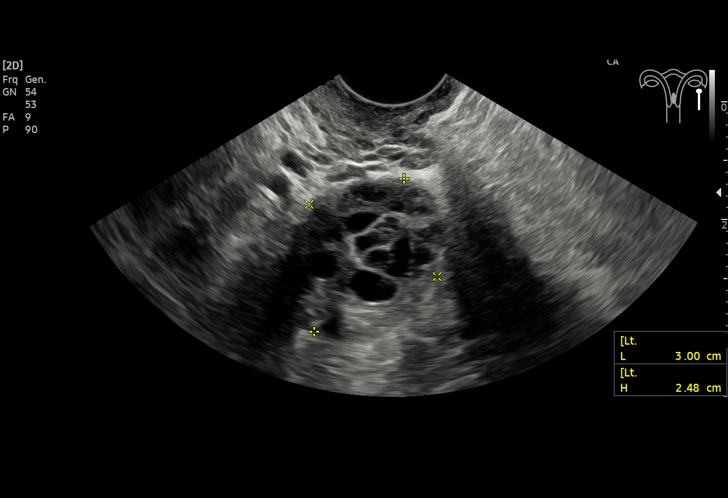
[im 61/61]
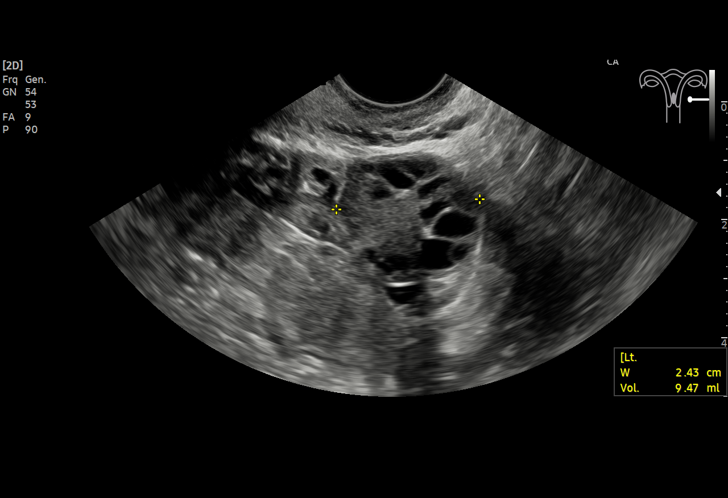

[15 of 25 positions shown; findings below may reference images not displayed]

FINDINGS: Uterus

Measurements: 7.2 x 2.7 x 4.1 centimeters = volume: 41.7 mL.
Retroflexed and normal in appearance.

Endometrium

Thickness: 8.4 millimeters.  No focal abnormality visualized.

Right ovary

Measurements: 2.9 x 2.0 x 2.2 centimeters = volume: 6.9 ml mL.
Normal appearance/no adnexal mass.

Left ovary

Measurements: 3.0 x 2.5 x 2.4 centimeters = volume: 9.5 cc mL.
Normal appearance/no adnexal mass.

Pulsed Doppler evaluation of both ovaries demonstrates normal
low-resistance arterial and venous waveforms.

Other findings

No abnormal free fluid.
IMPRESSION: Normal pelvic ultrasound.  No adnexal mass or torsion.

## 2022-10-18 ENCOUNTER — Other Ambulatory Visit: Payer: Self-pay

## 2022-10-18 MED ORDER — LETROZOLE 2.5 MG PO TABS
5.0000 mg | ORAL_TABLET | Freq: Every day | ORAL | 1 refills | Status: DC
  Filled 2022-10-18: qty 10, 5d supply, fill #0

## 2022-11-06 NOTE — Telephone Encounter (Signed)
Lmom for pt to call back to schedule to pick up orthotics in [place] (currently in [place])   No balance

## 2022-11-13 ENCOUNTER — Emergency Department: Payer: 59

## 2022-11-13 ENCOUNTER — Other Ambulatory Visit: Payer: Self-pay

## 2022-11-13 ENCOUNTER — Encounter: Payer: Self-pay | Admitting: Emergency Medicine

## 2022-11-13 ENCOUNTER — Emergency Department
Admission: EM | Admit: 2022-11-13 | Discharge: 2022-11-13 | Disposition: A | Discharge status: Home / Self Care | Payer: 59 | Attending: Emergency Medicine | Admitting: Emergency Medicine

## 2022-11-13 DIAGNOSIS — N979 Female infertility, unspecified: Secondary | ICD-10-CM | POA: Diagnosis not present

## 2022-11-13 DIAGNOSIS — S39012A Strain of muscle, fascia and tendon of lower back, initial encounter: Secondary | ICD-10-CM

## 2022-11-13 DIAGNOSIS — X509XXA Other and unspecified overexertion or strenuous movements or postures, initial encounter: Secondary | ICD-10-CM | POA: Diagnosis not present

## 2022-11-13 DIAGNOSIS — M545 Low back pain, unspecified: Secondary | ICD-10-CM | POA: Diagnosis not present

## 2022-11-13 LAB — URINALYSIS, ROUTINE W REFLEX MICROSCOPIC
Bilirubin Urine: NEGATIVE
Glucose, UA: NEGATIVE mg/dL
Ketones, ur: NEGATIVE mg/dL
Leukocytes,Ua: NEGATIVE
Nitrite: NEGATIVE
Protein, ur: NEGATIVE mg/dL
Specific Gravity, Urine: 1.017 (ref 1.005–1.030)
pH: 6 (ref 5.0–8.0)

## 2022-11-13 LAB — POC URINE PREG, ED: Preg Test, Ur: NEGATIVE

## 2022-11-13 LAB — PREGNANCY, URINE: Preg Test, Ur: NEGATIVE

## 2022-11-13 MED ORDER — MELOXICAM 15 MG PO TABS: 15.0000 mg | ORAL_TABLET | Freq: Every day | ORAL | 2 refills | Status: AC

## 2022-11-13 MED ORDER — BACLOFEN 10 MG PO TABS: 10.0000 mg | ORAL_TABLET | Freq: Three times a day (TID) | ORAL | 0 refills | Status: AC

## 2022-11-13 NOTE — ED Provider Notes (Signed)
[facility] Provider Note    Event Date/Time   First MD Initiated Contact with Patient 11/13/22 1632     (approximate)   History   Back Pain   HPI  Jaime Hernandez is a 27 y.o. female with history of PCOS, obesity, kidney stones presents emergency department with mid to lower back pain.  States worse with bending over.  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  Also states it is worse with a deep breath as it radiates all the way down her spine.  Denies shortness of breath.  No chest pain.  No abdominal pain.  No dysuria.      Physical Exam   Triage Vital Signs: ED Triage Vitals  Enc Vitals Group     BP 11/13/22 1545 116/77     Pulse Rate 11/13/22 1545 88     Resp 11/13/22 1545 17     Temp 11/13/22 1545 98.1 F (36.7 C)     Temp Source 11/13/22 1545 Oral     SpO2 11/13/22 1545 97 %     Weight --      Height --      Head Circumference --      Peak Flow --      Pain Score 11/13/22 1544 8     Pain Loc --      Pain Edu? --      Excl. in GC? --     Most recent vital signs: Vitals:   11/13/22 1545 11/13/22 1757  BP: 116/77 118/78  Pulse: 88 88  Resp: 17 16  Temp: 98.1 F (36.7 C) 98.1 F (36.7 C)  SpO2: 97% 98%     General: Awake, no distress.   CV:  Good peripheral perfusion. regular rate and  rhythm Resp:  Normal effort. Lungs cta Abd:  No distention.  Nontender, no CVA tenderness Other:  Lumbar spine tender, paravertebral spasm, patient is able to ambulate without difficulty, neurovascular intact   ED Results / Procedures / Treatments   Labs (all labs ordered are listed, but only abnormal results are displayed) Labs Reviewed  URINALYSIS, ROUTINE W REFLEX MICROSCOPIC - Abnormal; Notable for the following components:      Result Value   Color, Urine YELLOW (*)    APPearance CLEAR (*)    Hgb urine dipstick MODERATE (*)    Bacteria, UA RARE (*)    All other components within normal limits   PREGNANCY, URINE  POC URINE PREG, ED  POC URINE PREG, ED     EKG     RADIOLOGY X-ray lumbar spine    PROCEDURES:   Procedures   MEDICATIONS ORDERED IN ED: Medications - No data to display   IMPRESSION / MDM / ASSESSMENT AND PLAN / ED COURSE  I reviewed the triage vital signs and the nursing notes.                              Differential diagnosis includes, but is not limited to, kidney stone, UTI, pyelonephritis, lumbar strain  Patient's presentation is most consistent with acute complicated illness / injury requiring diagnostic workup.   The patient is afebrile does not have urinary type symptoms, a kidney stone, UTI or pyelonephritis is less likely  UA, pregnancy test, lumbar spine x-ray   Labs are reassuring  X-ray of the lumbar spine independently reviewed and interpreted by me as being negative for  any acute abnormality  Do feel the patient's presentation is most likely muscle strain.  Started her on meloxicam and baclofen.  She is to follow-up with her regular doctor if not improving in 3 days.  Return if worsening.  She is in agreement treatment plan.  Given a work note and discharged stable condition.   FINAL CLINICAL IMPRESSION(S) / ED DIAGNOSES   Final diagnoses:  Strain of lumbar region, initial encounter     Rx / DC Orders   ED Discharge Orders          Ordered    meloxicam (MOBIC) 15 MG tablet  Daily        11/13/22 1753    baclofen (LIORESAL) 10 MG tablet  3 times daily        11/13/22 1753             Note:  This document was prepared using Dragon voice recognition software and may include unintentional dictation errors.    Faythe Ghee, PA-C 11/13/22 1827    Merwyn Katos, MD 11/13/22 [phone]

## 2022-11-13 NOTE — Discharge Instructions (Addendum)
Follow-up with orthopedics in 1 improving 1 week.  Apply ice to your lower back.  Take medication as prescribed.  Return emergency department worsening

## 2022-11-13 NOTE — ED Triage Notes (Signed)
Pt reports mid/lower bilateral back pain that is worse when bending over. PT reports the pain is also worse with deep breathing. Denies [facility], denies injury.

## 2022-11-21 ENCOUNTER — Ambulatory Visit: Payer: Commercial Managed Care - PPO | Admitting: Family Medicine

## 2022-11-23 ENCOUNTER — Other Ambulatory Visit: Payer: Self-pay

## 2022-11-23 MED ORDER — MEDROXYPROGESTERONE ACETATE 10 MG PO TABS
10.0000 mg | ORAL_TABLET | Freq: Every day | ORAL | 0 refills | Status: DC
  Filled 2022-11-23: qty 10, 10d supply, fill #0

## 2022-12-04 ENCOUNTER — Other Ambulatory Visit: Payer: Self-pay

## 2022-12-04 MED ORDER — LETROZOLE 2.5 MG PO TABS
7.5000 mg | ORAL_TABLET | Freq: Every day | ORAL | 0 refills | Status: DC
  Filled 2022-12-04: qty 15, 5d supply, fill #0

## 2022-12-12 ENCOUNTER — Encounter: Payer: Self-pay | Admitting: Emergency Medicine

## 2022-12-12 ENCOUNTER — Other Ambulatory Visit: Payer: Self-pay

## 2022-12-12 ENCOUNTER — Emergency Department
Admission: EM | Admit: 2022-12-12 | Discharge: 2022-12-12 | Disposition: A | Discharge status: Home / Self Care | Payer: 59 | Attending: Emergency Medicine | Admitting: Emergency Medicine

## 2022-12-12 DIAGNOSIS — T50905S Adverse effect of unspecified drugs, medicaments and biological substances, sequela: Secondary | ICD-10-CM

## 2022-12-12 DIAGNOSIS — R519 Headache, unspecified: Secondary | ICD-10-CM | POA: Diagnosis not present

## 2022-12-12 DIAGNOSIS — T50995S Adverse effect of other drugs, medicaments and biological substances, sequela: Secondary | ICD-10-CM | POA: Diagnosis not present

## 2022-12-12 LAB — CBC WITH DIFFERENTIAL/PLATELET
Abs Immature Granulocytes: 0.04 10*3/uL (ref 0.00–0.07)
Basophils Absolute: 0.1 10*3/uL (ref 0.0–0.1)
Basophils Relative: 1 %
Eosinophils Absolute: 0.1 10*3/uL (ref 0.0–0.5)
Eosinophils Relative: 1 %
HCT: 43.7 % (ref 36.0–46.0)
Hemoglobin: 14.2 g/dL (ref 12.0–15.0)
Immature Granulocytes: 0 %
Lymphocytes Relative: 28 %
Lymphs Abs: 2.6 10*3/uL (ref 0.7–4.0)
MCH: 28.8 pg (ref 26.0–34.0)
MCHC: 32.5 g/dL (ref 30.0–36.0)
MCV: 88.6 fL (ref 80.0–100.0)
Monocytes Absolute: 0.5 10*3/uL (ref 0.1–1.0)
Monocytes Relative: 6 %
Neutro Abs: 5.9 10*3/uL (ref 1.7–7.7)
Neutrophils Relative %: 64 %
Platelets: 326 10*3/uL (ref 150–400)
RBC: 4.93 MIL/uL (ref 3.87–5.11)
RDW: 13.4 % (ref 11.5–15.5)
WBC: 9.3 10*3/uL (ref 4.0–10.5)
nRBC: 0 % (ref 0.0–0.2)

## 2022-12-12 LAB — BASIC METABOLIC PANEL
Anion gap: 9 (ref 5–15)
BUN: 11 mg/dL (ref 6–20)
CO2: 25 mmol/L (ref 22–32)
Calcium: 9.5 mg/dL (ref 8.9–10.3)
Chloride: 105 mmol/L (ref 98–111)
Creatinine, Ser: 0.62 mg/dL (ref 0.44–1.00)
GFR, Estimated: >60 mL/min (ref >60)
Glucose, Bld: 105 mg/dL — ABNORMAL HIGH (ref 70–99)
Potassium: 3.8 mmol/L (ref 3.5–5.1)
Sodium: 139 mmol/L (ref 135–145)

## 2022-12-12 LAB — HCG, QUANTITATIVE, PREGNANCY: hCG, Beta Chain, Quant, S: <1 m[IU]/mL (ref <5)

## 2022-12-12 MED ORDER — ONDANSETRON HCL 4 MG PO TABS
4.0000 mg | ORAL_TABLET | Freq: Every day | ORAL | 1 refills | Status: AC | PRN
  Filled 2022-12-12: qty 30, 30d supply, fill #0

## 2022-12-12 MED ORDER — KETOROLAC TROMETHAMINE 15 MG/ML IJ SOLN
15.0000 mg | Freq: Once | INTRAMUSCULAR | Status: AC
  Administered 2022-12-12: 15 mg via INTRAVENOUS
  Filled 2022-12-12: qty 1

## 2022-12-12 MED ORDER — SODIUM CHLORIDE 0.9 % IV SOLN: Freq: Once | INTRAVENOUS | Status: AC

## 2022-12-12 MED ORDER — DEXAMETHASONE SODIUM PHOSPHATE 10 MG/ML IJ SOLN
10.0000 mg | Freq: Once | INTRAMUSCULAR | Status: AC
  Administered 2022-12-12: 10 mg via INTRAVENOUS
  Filled 2022-12-12: qty 1

## 2022-12-12 MED ORDER — PROCHLORPERAZINE EDISYLATE 10 MG/2ML IJ SOLN: 10.0000 mg | Freq: Four times a day (QID) | INTRAMUSCULAR | Status: DC | PRN

## 2022-12-12 NOTE — ED Triage Notes (Signed)
Pt to ED via POV c/o sharp pain in her head x 2 weeks that is worse today. Pt is currently in NAD.

## 2022-12-12 NOTE — ED Provider Notes (Signed)
[facility] Emergency Department Provider Note     Event Date/Time   First MD Initiated Contact with Patient 12/12/22 1705     (approximate)   History   Headache   HPI  Jaime Hernandez is a 27 y.o. female with a past medical history of PCOS who presents to the emergency department with complaint of worsening headache over the last 2 weeks.  Pain is described as unilateral and intermittent.  Patient reports severe intensity today.  She reports starting her infertility medication, Clomid, and has concerns that this may be an adverse effect of it.  She has had similar symptoms in the past when taking infertility medicine.  Associated symptoms include mild confusion, " groggy", irritation to light, and nausea. She denies fever, vomiting, eye pain, visual changes, abdominal pain, shortness of breath, chest pain.     Physical Exam   Triage Vital Signs: ED Triage Vitals  Enc Vitals Group     BP 12/12/22 1651 127/84     Pulse Rate 12/12/22 1651 94     Resp 12/12/22 1651 16     Temp 12/12/22 1651 98.9 F (37.2 C)     Temp Source 12/12/22 1651 Oral     SpO2 12/12/22 1651 99 %     Weight 12/12/22 1649 260 lb (117.9 kg)     Height 12/12/22 1649 5\' 5"  (1.651 m)     Head Circumference --      Peak Flow --      Pain Score 12/12/22 1649 5     Pain Loc --      Pain Edu? --      Excl. in GC? --     Most recent vital signs: Vitals:   12/12/22 1651  BP: 127/84  Pulse: 94  Resp: 16  Temp: 98.9 F (37.2 C)  SpO2: 99%    General: Alert and oriented. INAD.  Normal speech.     Head:  NCAT.  Eyes:  PERRLA. EOMI. Conjunctivae clear. Neck:   Supple. No cervical spine tenderness to palpation. Full active range of motion without difficulty. CV:  Good peripheral perfusion. RRR.  RESP:  Normal effort. LCTAB.   ABD:  No distention. Soft. No tenderness to palpation. BACK:  Spinous process is midline without deformity.  MSK:   Patient freely moves all extremities.   NEURO: Cranial nerves II-XII intact. No focal deficits. Sensation and motor function intact.  Coordination intact.  Steady gait.  ED Results / Procedures / Treatments   Labs (all labs ordered are listed, but only abnormal results are displayed) Labs Reviewed  BASIC METABOLIC PANEL - Abnormal; Notable for the following components:      Result Value   Glucose, Bld 105 (*)    All other components within normal limits  CBC WITH DIFFERENTIAL/PLATELET  HCG, QUANTITATIVE, PREGNANCY   PROCEDURES:  Critical Care performed: No  Procedures   MEDICATIONS ORDERED IN ED: Medications  0.9 %  sodium chloride infusion (0 mLs Intravenous Stopped 12/12/22 1901)  ketorolac (TORADOL) 15 MG/ML injection 15 mg (15 mg Intravenous Given 12/12/22 1759)  dexamethasone (DECADRON) injection 10 mg (10 mg Intravenous Given 12/12/22 1800)     IMPRESSION / MDM / ASSESSMENT AND PLAN / ED COURSE  I reviewed the triage vital signs and the nursing notes.                              Differential diagnosis includes,  but is not limited to, tension headache, migraine with classic symptoms, medication side effect, dehydration, intracranial mass, intracranial hemorrhage highly unlikely but considered.   Patient's presentation is most consistent with acute complicated illness / injury requiring diagnostic workup.  27 year old female presents to the emergency department with subacute ongoing headache for 2 weeks with an increase in severity today.  She has a history of PCOS in which she is taking clomid for infertility at this time.  This is the patient's second trial of infertility medication and she reports having the same symptoms today that she has had before when taking clomid.  Primary assessment is reassuring.    Patient expresses her symptom concerns and is requesting a CT scan.  To help with clinical decision making I used clinical tool, Nexus criteria.  I have low suspicions of intracranial pathologies per history  and overall benign physical exam. I believe it is not indicated for a CT at this time.  I discussed this with the  patient and provided risk versus benefits.  We came to the agreement that we will treat and observe her symptoms before needing to move forward with imaging.  Patient verbalize understanding and agreement with this plan  Patient is given fluids, Toradol, and dexamethasone for symtpoms.  hCG urine pregnancy is negative.  CBC with differential and BMP is reassuring.  On reassessment, patient's symptoms have improved. She expresses she feels much better.  We discussed the need for further evaluation, although I feel further workup is not needed, I did consider patient's input in this decision.  She believes this to be a side effect from clomid medication and would like to be discharged. Patient's diagnosis is consistent with medication adverse effect with associated migraine.  Patient is instructed to rest and increase fluid intake.  Patient will be discharged home with prescriptions for Zofran for nausea. Patient is to follow up with [facility] clinic as needed or otherwise directed. Patient is given ED precautions to return to the ED for any worsening or new symptoms.     FINAL CLINICAL IMPRESSION(S) / ED DIAGNOSES   Final diagnoses:  Nonintractable headache, unspecified chronicity pattern, unspecified headache type  Medication adverse effect, sequela     Rx / DC Orders   ED Discharge Orders          Ordered    ondansetron (ZOFRAN) 4 MG tablet  Daily PRN        12/12/22 1937             Note:  This document was prepared using Dragon voice recognition software and may include unintentional dictation errors.    Kern Reap A, PA-C 12/13/22 1028    Chesley Noon, MD 12/15/22 [phone]

## 2022-12-13 ENCOUNTER — Encounter: Payer: Self-pay | Admitting: Emergency Medicine

## 2022-12-13 ENCOUNTER — Other Ambulatory Visit: Payer: Self-pay

## 2022-12-16 ENCOUNTER — Emergency Department: Payer: 59

## 2022-12-16 ENCOUNTER — Other Ambulatory Visit: Payer: Self-pay

## 2022-12-16 DIAGNOSIS — T385X5A Adverse effect of other estrogens and progestogens, initial encounter: Secondary | ICD-10-CM | POA: Diagnosis not present

## 2022-12-16 DIAGNOSIS — R519 Headache, unspecified: Secondary | ICD-10-CM | POA: Insufficient documentation

## 2022-12-16 DIAGNOSIS — T38895A Adverse effect of other hormones and synthetic substitutes, initial encounter: Secondary | ICD-10-CM | POA: Diagnosis not present

## 2022-12-16 NOTE — ED Triage Notes (Signed)
Reports L and posterior h/a described as pressure. Reports associated fatigue and brain fog. States seen here on Wednesday and that MD recommended hold off on CT but return for CT if symptoms continue. Pt denies dizziness, vision change, parasthesia. Pt ambulatory to triage. Alert and oriented following commands. Pupils equal and reactive.

## 2022-12-17 ENCOUNTER — Emergency Department
Admission: EM | Admit: 2022-12-17 | Discharge: 2022-12-17 | Disposition: A | Discharge status: Home / Self Care | Payer: 59 | Attending: Emergency Medicine | Admitting: Emergency Medicine

## 2022-12-17 DIAGNOSIS — T50905A Adverse effect of unspecified drugs, medicaments and biological substances, initial encounter: Secondary | ICD-10-CM

## 2022-12-17 DIAGNOSIS — R519 Headache, unspecified: Secondary | ICD-10-CM

## 2022-12-17 NOTE — Discharge Instructions (Signed)
Use Tylenol for pain and fevers.  Up to 1000 mg per dose, up to 4 times per day.  Do not take more than 4000 mg of Tylenol/acetaminophen within 24 hours..  

## 2022-12-17 NOTE — ED Provider Notes (Signed)
[facility] Provider Note    Event Date/Time   First MD Initiated Contact with Patient 12/17/22 0036     (approximate)   History   Headache   HPI  Jaime Hernandez is a 27 y.o. female who presents to the ED for evaluation of Headache   I review ED visit from 5/8 where patient was evaluated for headache.  History of PCOS, morbid obesity.  Had recently started an infertility medication, Clomid  Patient presents to the ED for evaluation of the same headache that she has been experiencing intermittently for the past couple weeks.  Reports that she is requesting a CT scan to make sure things okay in her brain.  She apparently had requested this last time but it was not indicated so she was discharged after reassuring evaluation and resolution of symptoms after Toradol.  At home, she reports intermittent occipital pressure headache transiently resolved with ibuprofen but keeps coming back.   Physical Exam   Triage Vital Signs: ED Triage Vitals  Enc Vitals Group     BP 12/16/22 2248 (!) 129/93     Pulse Rate 12/16/22 2248 96     Resp 12/16/22 2248 18     Temp 12/16/22 2251 98.3 F (36.8 C)     Temp Source 12/16/22 2248 Oral     SpO2 12/16/22 2248 98 %     Weight 12/16/22 2249 260 lb (117.9 kg)     Height 12/16/22 2249 5\' 5"  (1.651 m)     Head Circumference --      Peak Flow --      Pain Score 12/16/22 2248 6     Pain Loc --      Pain Edu? --      Excl. in GC? --     Most recent vital signs: Vitals:   12/16/22 2248 12/16/22 2251  BP: (!) 129/93   Pulse: 96   Resp: 18   Temp:  98.3 F (36.8 C)  SpO2: 98%     General: Awake, no distress.  Morbidly obese, pleasant conversational and ambulatory. CV:  Good peripheral perfusion.  Resp:  Normal effort.  Abd:  No distention.  MSK:  No deformity noted.  Neuro:  No focal deficits appreciated. Cranial nerves II through XII intact 5/5 strength and sensation in all 4 extremities Other:     ED  Results / Procedures / Treatments   Labs (all labs ordered are listed, but only abnormal results are displayed) Labs Reviewed - No data to display  EKG   RADIOLOGY CT head interpreted by me without evidence of acute intracranial pathology  Official radiology report(s): CT Head Wo Contrast  Result Date: 12/16/2022 CLINICAL DATA:  Left posterior headache. EXAM: CT HEAD WITHOUT CONTRAST TECHNIQUE: Contiguous axial images were obtained from the base of the skull through the vertex without intravenous contrast. RADIATION DOSE REDUCTION: This exam was performed according to the departmental dose-optimization program which includes automated exposure control, adjustment of the mA and/or kV according to patient size and/or use of iterative reconstruction technique. COMPARISON:  Head CT 06/19/2022 FINDINGS: Brain: No evidence of acute infarction, hemorrhage, hydrocephalus, extra-axial collection or mass lesion/mass effect. Vascular: No hyperdense vessel or unexpected calcification. Skull: Normal. Negative for fracture or focal lesion. Sinuses/Orbits: No acute finding. Other: None. IMPRESSION: No acute intracranial abnormality. Electronically Signed   By: Darliss Cheney M.D.   On: 12/16/2022 23:09    PROCEDURES and INTERVENTIONS:  Procedures  Medications - No data to display  IMPRESSION / MDM / ASSESSMENT AND PLAN / ED COURSE  I reviewed the triage vital signs and the nursing notes.  Differential diagnosis includes, but is not limited to, ICH, stroke, IIH, medication side effect, migraine  {Patient presents with symptoms of an acute illness or injury that is potentially life-threatening.  Patient presents with bad headache that I suspect is related to her recently started medications for fertility.  She is reassuring CT head, exam and clinical picture.  No indications for repeat serum diagnostics.  I offered medications here but she declines preferring to go home with the reassurance of the CT  head.  We discussed management and return precautions      FINAL CLINICAL IMPRESSION(S) / ED DIAGNOSES   Final diagnoses:  Bad headache  Medication side effect, initial encounter     Rx / DC Orders   ED Discharge Orders     None        Note:  This document was prepared using Dragon voice recognition software and may include unintentional dictation errors.   Delton Prairie, MD 12/17/22 [phone]

## 2022-12-27 DIAGNOSIS — N979 Female infertility, unspecified: Secondary | ICD-10-CM | POA: Diagnosis not present

## 2023-01-01 ENCOUNTER — Other Ambulatory Visit: Payer: Self-pay

## 2023-01-01 ENCOUNTER — Emergency Department
Admission: EM | Admit: 2023-01-01 | Discharge: 2023-01-01 | Disposition: A | Discharge status: Home / Self Care | Payer: 59 | Attending: Emergency Medicine | Admitting: Emergency Medicine

## 2023-01-01 ENCOUNTER — Emergency Department: Payer: 59

## 2023-01-01 DIAGNOSIS — Z7901 Long term (current) use of anticoagulants: Secondary | ICD-10-CM | POA: Diagnosis not present

## 2023-01-01 DIAGNOSIS — R112 Nausea with vomiting, unspecified: Secondary | ICD-10-CM | POA: Insufficient documentation

## 2023-01-01 DIAGNOSIS — R1030 Lower abdominal pain, unspecified: Secondary | ICD-10-CM | POA: Insufficient documentation

## 2023-01-01 DIAGNOSIS — R109 Unspecified abdominal pain: Secondary | ICD-10-CM | POA: Diagnosis present

## 2023-01-01 LAB — COMPREHENSIVE METABOLIC PANEL
ALT: 22 U/L (ref 0–44)
AST: 18 U/L (ref 15–41)
Albumin: 4.2 g/dL (ref 3.5–5.0)
Alkaline Phosphatase: 78 U/L (ref 38–126)
Anion gap: 10 (ref 5–15)
BUN: 19 mg/dL (ref 6–20)
CO2: 20 mmol/L — ABNORMAL LOW (ref 22–32)
Calcium: 9.1 mg/dL (ref 8.9–10.3)
Chloride: 108 mmol/L (ref 98–111)
Creatinine, Ser: 0.81 mg/dL (ref 0.44–1.00)
GFR, Estimated: >60 mL/min (ref >60)
Glucose, Bld: 117 mg/dL — ABNORMAL HIGH (ref 70–99)
Potassium: 3.9 mmol/L (ref 3.5–5.1)
Sodium: 138 mmol/L (ref 135–145)
Total Bilirubin: 0.3 mg/dL (ref 0.3–1.2)
Total Protein: 8 g/dL (ref 6.5–8.1)

## 2023-01-01 LAB — URINALYSIS, ROUTINE W REFLEX MICROSCOPIC
Bilirubin Urine: NEGATIVE
Glucose, UA: NEGATIVE mg/dL
Hgb urine dipstick: NEGATIVE
Ketones, ur: 20 mg/dL — AB
Nitrite: NEGATIVE
Protein, ur: NEGATIVE mg/dL
Specific Gravity, Urine: 1.023 (ref 1.005–1.030)
pH: 5 (ref 5.0–8.0)

## 2023-01-01 LAB — CBC
HCT: 44.7 % (ref 36.0–46.0)
Hemoglobin: 14.5 g/dL (ref 12.0–15.0)
MCH: 28.8 pg (ref 26.0–34.0)
MCHC: 32.4 g/dL (ref 30.0–36.0)
MCV: 88.7 fL (ref 80.0–100.0)
Platelets: 302 10*3/uL (ref 150–400)
RBC: 5.04 MIL/uL (ref 3.87–5.11)
RDW: 13.8 % (ref 11.5–15.5)
WBC: 11.1 10*3/uL — ABNORMAL HIGH (ref 4.0–10.5)
nRBC: 0 % (ref 0.0–0.2)

## 2023-01-01 LAB — POC URINE PREG, ED: Preg Test, Ur: NEGATIVE

## 2023-01-01 LAB — HCG, QUANTITATIVE, PREGNANCY: hCG, Beta Chain, Quant, S: <1 m[IU]/mL (ref <5)

## 2023-01-01 LAB — LIPASE, BLOOD: Lipase: 24 U/L (ref 11–51)

## 2023-01-01 MED ORDER — METOCLOPRAMIDE HCL 10 MG PO TABS
10.0000 mg | ORAL_TABLET | Freq: Once | ORAL | Status: AC
  Administered 2023-01-01: 10 mg via ORAL
  Filled 2023-01-01: qty 1

## 2023-01-01 NOTE — Discharge Instructions (Signed)
Your workup was reassuring there was no signs of pregnancy however at this time.  You can take your Zofran to help with nausea given you are not pregnant.  You did look slightly dehydrated so drink plenty of fluids including Gatorade or Pedialyte and return to the ER if you develop any worsening abdominal discomfort especially if you develop right lower abdominal pain you need workup for appendicitis potentially.  IMPRESSION: Unremarkable pelvic ultrasound.

## 2023-01-01 NOTE — ED Provider Notes (Signed)
[facility] Provider Note    Event Date/Time   First MD Initiated Contact with Patient 01/01/23 1702     (approximate)   History   Nausea   HPI  Jaime Hernandez is a 27 y.o. female who comes in with concerns for nausea vomiting inability to keep down liquids.  Patient reports that she has PCOS.  She states that she is trying to get pregnant and is currently on letrozole.  She states that she supposed to start her period on Thursday so she is not sure if she could just be pregnant.  She reports lower abdominal discomfort associated with it.  She states that she denies any vaginal discharge or concerns for STDs has been with 1 partner.  She is followed by Dr. Jean Rosenthal from OB/GYN.     Physical Exam   Triage Vital Signs: ED Triage Vitals  Enc Vitals Group     BP 01/01/23 1606 (!) 145/62     Pulse Rate 01/01/23 1606 (!) 104     Resp 01/01/23 1606 18     Temp 01/01/23 1604 98.4 F (36.9 C)     Temp src --      SpO2 01/01/23 1606 96 %     Weight 01/01/23 1604 257 lb 15 oz (117 kg)     Height 01/01/23 1604 5\' 5"  (1.651 m)     Head Circumference --      Peak Flow --      Pain Score 01/01/23 1604 0     Pain Loc --      Pain Edu? --      Excl. in GC? --     Most recent vital signs: Vitals:   01/01/23 1604 01/01/23 1606  BP:  (!) 145/62  Pulse:  (!) 104  Resp:  18  Temp: 98.4 F (36.9 C)   SpO2:  96%     General: Awake, no distress.  CV:  Good peripheral perfusion.  Resp:  Normal effort.  Abd:  No distention.  Slightly tender in the lower abdomen more supra back in nature. Other:     ED Results / Procedures / Treatments   Labs (all labs ordered are listed, but only abnormal results are displayed) Labs Reviewed  COMPREHENSIVE METABOLIC PANEL - Abnormal; Notable for the following components:      Result Value   CO2 20 (*)    Glucose, Bld 117 (*)    All other components within normal limits  CBC - Abnormal; Notable for the following  components:   WBC 11.1 (*)    All other components within normal limits  LIPASE, BLOOD  URINALYSIS, ROUTINE W REFLEX MICROSCOPIC  POC URINE PREG, ED       RADIOLOGY I have reviewed the ultrasound personally interpreted  PROCEDURES:  Critical Care performed: No  Procedures   MEDICATIONS ORDERED IN ED: Medications - No data to display   IMPRESSION / MDM / ASSESSMENT AND PLAN / ED COURSE  I reviewed the triage vital signs and the nursing notes.   Patient's presentation is most consistent with acute presentation with potential threat to life or bodily function.   Patient comes in with concern for early pregnancy in the setting of hormone treatment with letrozole.  Also with a history of PCOS so we will get ultrasound to make sure no signs of ovarian torsion or cyst.  We discussed IV fluids versus p.o. hydration.  She like to try to hold off on IV for now  try some p.o. Reglan.  Do not feel she has got any significant right lower quadrant pain to suggest appendicitis.    Pregnancy test was negative.  Lipase normal CMP shows slightly low bicarb.  White count slightly elevated on CBC.  Blood hCG was negative.IMPRESSION: Unremarkable pelvic ultrasound.  Reevaluated patient.  Her abdomen seems soft and nontender.  She got no right lower quadrant pain and we discussed development of pain here she would need CT imaging to rule out appendicitis but at this time she does not have any pain.  She does report that typically when she gets the pain is more on the left side the left ovary was visualized as normal.  We discussed pelvic exam but she declined.  She states that she feels much better after medication and she would like to be discharged home.  Offered to do p.o. challenge and she declined stating that she would just like to be discharged.  Offered her Reglan for medication at home but patient declined stating that she already has medication at home that she can use.  She will follow-up  with her OB/GYN but feeling much better and comfortable with DC at this time        FINAL CLINICAL IMPRESSION(S) / ED DIAGNOSES   Final diagnoses:  Nausea and vomiting, unspecified vomiting type  Abdominal pain, unspecified abdominal location     Rx / DC Orders   ED Discharge Orders     None        Note:  This document was prepared using Dragon voice recognition software and may include unintentional dictation errors.   Concha Se, MD 01/01/23 [phone]

## 2023-01-01 NOTE — ED Triage Notes (Addendum)
Pt to ED for nausea/vomiting started today. States cannot keep liquids down. Pt with food and drink in triage.  States might be pregnant

## 2023-02-06 DIAGNOSIS — M722 Plantar fascial fibromatosis: Secondary | ICD-10-CM | POA: Diagnosis not present

## 2023-02-06 DIAGNOSIS — M79672 Pain in left foot: Secondary | ICD-10-CM | POA: Diagnosis not present

## 2023-02-06 DIAGNOSIS — M7989 Other specified soft tissue disorders: Secondary | ICD-10-CM | POA: Diagnosis not present

## 2023-02-07 ENCOUNTER — Emergency Department: Payer: 59

## 2023-02-07 ENCOUNTER — Emergency Department
Admission: EM | Admit: 2023-02-07 | Discharge: 2023-02-07 | Disposition: A | Discharge status: Home / Self Care | Payer: 59 | Attending: Emergency Medicine | Admitting: Emergency Medicine

## 2023-02-07 ENCOUNTER — Other Ambulatory Visit: Payer: Self-pay

## 2023-02-07 DIAGNOSIS — M79672 Pain in left foot: Secondary | ICD-10-CM | POA: Diagnosis not present

## 2023-02-07 DIAGNOSIS — M7989 Other specified soft tissue disorders: Secondary | ICD-10-CM | POA: Diagnosis not present

## 2023-02-07 DIAGNOSIS — M722 Plantar fascial fibromatosis: Secondary | ICD-10-CM

## 2023-02-07 MED ORDER — NAPROXEN 500 MG PO TABS
500.0000 mg | ORAL_TABLET | Freq: Once | ORAL | Status: AC
  Administered 2023-02-07: 500 mg via ORAL
  Filled 2023-02-07: qty 1

## 2023-02-07 MED ORDER — NAPROXEN 500 MG PO TABS: 500.0000 mg | ORAL_TABLET | Freq: Two times a day (BID) | ORAL | 0 refills | Status: DC

## 2023-02-07 NOTE — ED Triage Notes (Signed)
Pt presents to ER with c/o left foot swelling that pt attributes to spending long periods of time walking around at work.  Pt endorses pain to medial aspect of left foot that is made better when sitting down, but states it still has an ache to it.  Pt does have swelling noted to left foot, but no deformity noted.  Pt otherwise alert and in NAD on arrival.

## 2023-02-07 NOTE — ED Provider Notes (Signed)
[facility] Provider Note    Event Date/Time   First MD Initiated Contact with Patient 02/07/23 0249     (approximate)   History   Foot Swelling   HPI  Jaime Hernandez is a 27 y.o. 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  Reports pain to the sole and medial aspect of left foot with some swelling.  Denies injury.  Voices no other complaints or injuries.  Denies chest pain or shortness of breath.     Past Medical History   Past Medical History:  Diagnosis Date   Obesity    PCOS (polycystic ovarian syndrome)    PCOS (polycystic ovarian syndrome)      Active Problem List  There are no problems to display for this patient.    Past Surgical History   Past Surgical History:  Procedure Laterality Date   WISDOM TOOTH EXTRACTION       Home Medications   Prior to Admission medications   Medication Sig Start Date End Date Taking? Authorizing Provider  naproxen (NAPROSYN) 500 MG tablet Take 1 tablet (500 mg total) by mouth 2 (two) times daily with a meal. 02/07/23  Yes Irean Hong, MD  albuterol (PROVENTIL HFA) 108 (90 Base) MCG/ACT inhaler Inhale 2 puffs into the lungs every 4 (four) hours as needed for wheezing or shortness of breath. 05/17/21   Sharman Cheek, MD  albuterol (VENTOLIN HFA) 108 (90 Base) MCG/ACT inhaler Inhale 2 inhalations into the lungs every 6 (six) hours as needed 07/23/22     atenolol (TENORMIN) 25 MG tablet Take 1 tablet (25 mg total) by mouth once daily 06/20/22     benzonatate (TESSALON PERLES) 100 MG capsule Take 1 capsule (100 mg total) by mouth 3 (three) times daily as needed for cough. 07/27/22 07/27/23  Cuthriell, Delorise Royals, PA-C  brompheniramine-pseudoephedrine-DM  30-2-10 MG/5ML syrup Take 10 mLs by mouth 4 (four) times daily as needed. 07/27/22   Cuthriell, Delorise Royals, PA-C  cefdinir (OMNICEF) 300 MG capsule Take 1 capsule (300 mg total) by mouth 2 (two) times daily for 10 days 07/23/22     ibuprofen (ADVIL) 800 MG tablet Take 1 tablet (800 mg total) by mouth every 8 (eight) hours as needed. 05/22/22   Ward, Layla Maw, DO  letrozole [facility]) 2.5 MG tablet Take 2 tablets (5 mg total) by mouth daily starting on day 3 of cycle until day 7. 10/18/22     letrozole (FEMARA) 2.5 MG tablet Take 3 tablets (7.5 mg total) by mouth daily for 5 days starting on day 3 of cycle until day 7. 12/04/22     meclizine (ANTIVERT) 25 MG tablet Take 1 tablet (25 mg total) by mouth 3 (three) times daily as needed for dizziness. 08/08/22   Fisher, Roselyn Bering, PA-C  meclizine (ANTIVERT) 25 MG tablet Take 1 tablet (25 mg total) by mouth 3 (three) times daily as needed for dizziness. 08/10/22   Merwyn Katos, MD  medroxyPROGESTERone (PROVERA) 10 MG tablet Take 1 tablet (10 mg total) by mouth daily for 10 days. 11/23/22     meloxicam (MOBIC) 15 MG tablet Take 1 tablet (15 mg total) by mouth daily. 11/13/22 11/13/23  Faythe Ghee, PA-C  metFORMIN (GLUCOPHAGE) 500 MG tablet Take 2 tablets (1,000 mg total) by mouth 2 (two) times daily with a meal. 500 at bedtime for 1 wk.  Then increase to twice per day with meals as directed. 07/11/21   Linzie Collin, MD  Multiple Vitamins-Minerals (MULTIVITAL PO) Take by mouth.    [provider]  ondansetron (ZOFRAN) 4 MG tablet Take 1 tablet (4 mg total) by mouth daily as needed for nausea or vomiting. 12/12/22 12/12/23  Romeo Apple, Myah A, PA-C  predniSONE (DELTASONE) 50 MG tablet Take 1 tablet (50 mg total) by mouth daily with breakfast. 07/27/22   Cuthriell, Delorise Royals, PA-C  promethazine-dextromethorphan (PROMETHAZINE-DM) 6.25-15 MG/5ML syrup Take 5 mLs by mouth every 6 (six) hours as needed for up to 7 days 07/23/22        Allergies   Acetaminophen-codeine and Clomid [clomiphene citrate]   Family History   Family History  Problem Relation Age of Onset   Cancer Maternal Uncle 63       thyroid     Physical Exam  Triage Vital Signs: ED Triage Vitals  Enc Vitals Group     BP 02/06/23 2358 (!) 135/98     Pulse Rate 02/06/23 2358 94     Resp 02/06/23 2358 18     Temp 02/06/23 2358 98.9 F (37.2 C)     Temp Source 02/06/23 2358 Oral     SpO2 02/06/23 2358 96 %     Weight 02/06/23 2358 257 lb (116.6 kg)     Height 02/06/23 2358 5\' 5"  (1.651 m)     Head Circumference --      Peak Flow --      Pain Score 02/07/23 0007 6     Pain Loc --      Pain Edu? --      Excl. in GC? --     Updated Vital Signs: BP (!) 135/98 (BP Location: Left Arm)   Pulse 94   Temp 98.9 F (37.2 C) (Oral)   Resp 18   Ht 5\' 5"  (1.651 m)   Wt 116.6 kg   LMP 01/05/2023 (Exact Date)   SpO2 96%   BMI 42.77 kg/m    General: Awake, no distress.  CV:  Good peripheral perfusion.  Resp:  Normal effort.  Abd:  No distention.  Other:  Left foot: Tender to palpation sole of foot.  Instep slightly swollen.  2+ distal pulses.  Brisk, less than 5-second cap refill.   ED Results / Procedures / Treatments  Labs (all labs ordered are listed, but only abnormal results are displayed) Labs Reviewed - No data to display   EKG  None   RADIOLOGY I have independently visualized and interpreted patient's x-ray as well as noted the radiology interpretation:  Foot x-ray: No acute abnormality  Official radiology report(s): DG Foot Complete Left  Result Date: 02/07/2023 CLINICAL DATA:  Left foot pain and swelling, no known injury, initial encounter EXAM: LEFT FOOT - COMPLETE 3+ VIEW COMPARISON:  None Available. FINDINGS: There is no evidence of fracture or dislocation. There is no evidence of arthropathy or other focal bone abnormality. Soft tissues are unremarkable. IMPRESSION: No acute abnormality noted. Electronically Signed   By: Alcide Clever M.D.   On: 02/07/2023 01:18     PROCEDURES:  Critical Care performed: No  Procedures   MEDICATIONS ORDERED IN ED: Medications  naproxen (NAPROSYN) tablet 500 mg (has no administration in time range)     IMPRESSION / MDM /  ASSESSMENT AND PLAN / ED COURSE  I reviewed the triage vital signs and the nursing notes.                             27 year old female presenting with left sole and instep pain and swelling.  Clinically consistent with plantar fasciitis.  Will administer NSAIDs, place in podiatric shoe and refer to podiatry for outpatient follow-up.  Strict return precautions given.  Patient verbalizes understanding and agrees with plan of care.  Patient's presentation is most consistent with acute, uncomplicated illness.   FINAL CLINICAL IMPRESSION(S) / ED DIAGNOSES   Final diagnoses:  Left foot pain  Plantar fasciitis     Rx / DC Orders   ED Discharge Orders          Ordered    naproxen (NAPROSYN) 500 MG tablet  2 times daily with meals        02/07/23 0256             Note:  This document was prepared using Dragon voice recognition software and may include unintentional dictation errors.   Irean Hong, MD 02/07/23 [phone]

## 2023-02-07 NOTE — Discharge Instructions (Addendum)
You may take Naprosyn twice daily for pain. Elevate affected area and apply ice to reduce swelling.  You may wear the boot you already have at home.  Return to the ER for worsening symptoms or other concerns.

## 2023-02-21 ENCOUNTER — Other Ambulatory Visit: Payer: Self-pay

## 2023-02-21 DIAGNOSIS — G8929 Other chronic pain: Secondary | ICD-10-CM | POA: Insufficient documentation

## 2023-02-21 DIAGNOSIS — R1030 Lower abdominal pain, unspecified: Secondary | ICD-10-CM | POA: Insufficient documentation

## 2023-02-21 DIAGNOSIS — R110 Nausea: Secondary | ICD-10-CM | POA: Diagnosis not present

## 2023-02-21 DIAGNOSIS — R197 Diarrhea, unspecified: Secondary | ICD-10-CM | POA: Insufficient documentation

## 2023-02-21 LAB — COMPREHENSIVE METABOLIC PANEL
ALT: 91 U/L — ABNORMAL HIGH (ref 0–44)
AST: 41 U/L (ref 15–41)
Albumin: 4 g/dL (ref 3.5–5.0)
Alkaline Phosphatase: 104 U/L (ref 38–126)
Anion gap: 9 (ref 5–15)
BUN: 17 mg/dL (ref 6–20)
CO2: 22 mmol/L (ref 22–32)
Calcium: 9 mg/dL (ref 8.9–10.3)
Chloride: 105 mmol/L (ref 98–111)
Creatinine, Ser: 0.65 mg/dL (ref 0.44–1.00)
GFR, Estimated: >60 mL/min (ref >60)
Glucose, Bld: 108 mg/dL — ABNORMAL HIGH (ref 70–99)
Potassium: 3.6 mmol/L (ref 3.5–5.1)
Sodium: 136 mmol/L (ref 135–145)
Total Bilirubin: 0.3 mg/dL (ref 0.3–1.2)
Total Protein: 7.2 g/dL (ref 6.5–8.1)

## 2023-02-21 LAB — CBC
HCT: 40 % (ref 36.0–46.0)
Hemoglobin: 13 g/dL (ref 12.0–15.0)
MCH: 28.6 pg (ref 26.0–34.0)
MCHC: 32.5 g/dL (ref 30.0–36.0)
MCV: 88.1 fL (ref 80.0–100.0)
Platelets: 289 10*3/uL (ref 150–400)
RBC: 4.54 MIL/uL (ref 3.87–5.11)
RDW: 13.4 % (ref 11.5–15.5)
WBC: 10.3 10*3/uL (ref 4.0–10.5)
nRBC: 0 % (ref 0.0–0.2)

## 2023-02-21 LAB — URINALYSIS, ROUTINE W REFLEX MICROSCOPIC
Bilirubin Urine: NEGATIVE
Glucose, UA: NEGATIVE mg/dL
Hgb urine dipstick: NEGATIVE
Ketones, ur: NEGATIVE mg/dL
Leukocytes,Ua: NEGATIVE
Nitrite: NEGATIVE
Protein, ur: NEGATIVE mg/dL
Specific Gravity, Urine: 1.027 (ref 1.005–1.030)
pH: 5 (ref 5.0–8.0)

## 2023-02-21 LAB — POC URINE PREG, ED: Preg Test, Ur: NEGATIVE

## 2023-02-21 LAB — LIPASE, BLOOD: Lipase: 27 U/L (ref 11–51)

## 2023-02-21 MED ORDER — ONDANSETRON 4 MG PO TBDP
4.0000 mg | ORAL_TABLET | Freq: Once | ORAL | Status: AC | PRN
  Administered 2023-02-21: 4 mg via ORAL
  Filled 2023-02-21: qty 1

## 2023-02-21 NOTE — ED Triage Notes (Signed)
Pt to ED via POV c/o abd pain, N/V, and diarrhea. Abd pain and N/V for about a week. Diarrhea started today. Denies CP, SOB, fevers

## 2023-02-22 ENCOUNTER — Emergency Department
Admission: EM | Admit: 2023-02-22 | Discharge: 2023-02-22 | Disposition: A | Discharge status: Home / Self Care | Payer: 59 | Attending: Student in an Organized Health Care Education/Training Program | Admitting: Student in an Organized Health Care Education/Training Program

## 2023-02-22 ENCOUNTER — Other Ambulatory Visit: Payer: Self-pay

## 2023-02-22 DIAGNOSIS — R197 Diarrhea, unspecified: Secondary | ICD-10-CM

## 2023-02-22 DIAGNOSIS — R110 Nausea: Secondary | ICD-10-CM

## 2023-02-22 MED ORDER — ONDANSETRON 4 MG PO TBDP
4.0000 mg | ORAL_TABLET | Freq: Three times a day (TID) | ORAL | 0 refills | Status: DC | PRN
  Filled 2023-02-22: qty 8, 3d supply, fill #0

## 2023-02-22 NOTE — ED Provider Notes (Signed)
[facility] Provider Note    Event Date/Time   First MD Initiated Contact with Patient 02/22/23 0040     (approximate)   History   Abdominal Pain and Diarrhea   HPI  Jaime Hernandez is a 27 y.o. female presents to the ER for evaluation of several days of nausea as well as some loose stool.  Denies any measured fevers.  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  Denies any fevers or chills.  Does have history of PCOS and has chronic lower abdominal pain denies any new pain.  Denies any blood in her stool.  Tolerating p.o.  Was given Zofran in triage with significant improvement.     Physical Exam   Triage Vital Signs: ED Triage Vitals  Encounter Vitals Group     BP 02/21/23 2102 105/68     Systolic BP Percentile --      Diastolic BP Percentile --      Pulse Rate 02/21/23 2102 93     Resp 02/21/23 2102 17     Temp 02/21/23 2102 98.5 F (36.9 C)     Temp Source 02/21/23 2102 Oral     SpO2 02/21/23 2102 98 %     Weight 02/21/23 2102 254 lb (115.2 kg)     Height 02/21/23 2102 5\' 5"  (1.651 m)     Head Circumference --      Peak Flow --      Pain Score 02/21/23 2102 6     Pain Loc --      Pain Education --      Exclude from Growth Chart --     Most recent vital signs: Vitals:   02/21/23 2102  BP: 105/68  Pulse: 93  Resp: 17  Temp: 98.5 F (36.9 C)  SpO2: 98%     Constitutional: Alert  Eyes: Conjunctivae are normal.  Head: Atraumatic. Nose: No congestion/rhinnorhea. Mouth/Throat: Mucous membranes are moist.   Neck: Painless ROM.  Cardiovascular:   Good peripheral circulation. Respiratory: Normal respiratory effort.  No retractions.  Gastrointestinal: Soft and nontender in all 4 quadrants..  Musculoskeletal:  no deformity Neurologic:  MAE spontaneously. No gross focal neurologic deficits are appreciated.  Skin:  Skin is warm, dry and intact. No rash noted. Psychiatric: Mood and affect are normal.  Speech and behavior are normal.    ED Results / Procedures / Treatments   Labs (all labs ordered are listed, but only abnormal results are displayed) Labs Reviewed  COMPREHENSIVE METABOLIC PANEL - Abnormal; Notable for the following components:      Result Value   Glucose, Bld 108 (*)    ALT 91 (*)    All other components within normal limits  URINALYSIS, ROUTINE W REFLEX MICROSCOPIC - Abnormal; Notable for the following components:   Color, Urine YELLOW (*)    APPearance CLEAR (*)    All other components within normal limits  LIPASE, BLOOD  CBC  POC URINE PREG, ED     EKG     RADIOLOGY Please see ED Course for my review and interpretation.  I personally reviewed all radiographic images ordered to evaluate for the above acute complaints and reviewed radiology reports and findings.  These findings were personally discussed with the patient.  Please see medical record for radiology report.    PROCEDURES:  Critical Care performed: No  Procedures   MEDICATIONS ORDERED IN ED: Medications  ondansetron (ZOFRAN-ODT) disintegrating tablet 4 mg (4 mg Oral  Given 02/21/23 2106)     IMPRESSION / MDM / ASSESSMENT AND PLAN / ED COURSE  I reviewed the triage vital signs and the nursing notes.                              Differential diagnosis includes, but is not limited to, enteritis, gastritis, dehydration, IBD, UTI, pregnancy, ovarian cysts, appendicitis  Patient presenting to the ER for evaluation of symptoms as described above.  Based on symptoms, risk factors and considered above differential, this presenting complaint could reflect a potentially life-threatening illness therefore the patient will be placed on continuous pulse oximetry and telemetry for monitoring.  Laboratory evaluation will be sent to evaluate for the above complaints.  Patient clinically well-appearing in no acute distress.  Blood work is reassuring.  Abdominal exam is benign.  Appears well-perfused  well-hydrated do not feel that further diagnostic testing clinically indicated given this presentation and reassuring exam and workup.  Do think is appropriate for trial of conservative management.  Discussed signs and symptoms for which she should return to the ER.       FINAL CLINICAL IMPRESSION(S) / ED DIAGNOSES   Final diagnoses:  Diarrhea, unspecified type  Nausea     Rx / DC Orders   ED Discharge Orders          Ordered    ondansetron (ZOFRAN-ODT) 4 MG disintegrating tablet  Every 8 hours PRN        02/22/23 0051             Note:  This document was prepared using Dragon voice recognition software and may include unintentional dictation errors.    Willy Eddy, MD 02/22/23 [phone]

## 2023-03-08 ENCOUNTER — Other Ambulatory Visit: Payer: Self-pay

## 2023-03-12 DIAGNOSIS — Z319 Encounter for procreative management, unspecified: Secondary | ICD-10-CM | POA: Diagnosis not present

## 2023-04-18 ENCOUNTER — Other Ambulatory Visit: Payer: Self-pay

## 2023-04-18 DIAGNOSIS — Z319 Encounter for procreative management, unspecified: Secondary | ICD-10-CM | POA: Diagnosis not present

## 2023-04-18 MED ORDER — DOXYCYCLINE HYCLATE 100 MG PO CAPS
100.0000 mg | ORAL_CAPSULE | Freq: Two times a day (BID) | ORAL | 0 refills | Status: DC
  Filled 2023-04-18: qty 6, 3d supply, fill #0

## 2023-04-19 ENCOUNTER — Other Ambulatory Visit: Payer: Self-pay

## 2023-04-24 ENCOUNTER — Other Ambulatory Visit: Payer: Self-pay

## 2023-04-24 DIAGNOSIS — N926 Irregular menstruation, unspecified: Secondary | ICD-10-CM | POA: Diagnosis not present

## 2023-04-24 DIAGNOSIS — N979 Female infertility, unspecified: Secondary | ICD-10-CM | POA: Diagnosis not present

## 2023-04-24 MED ORDER — MEDROXYPROGESTERONE ACETATE 10 MG PO TABS
10.0000 mg | ORAL_TABLET | Freq: Every day | ORAL | 0 refills | Status: DC
  Filled 2023-04-24: qty 7, 7d supply, fill #0

## 2023-05-20 ENCOUNTER — Emergency Department: Payer: 59

## 2023-05-20 ENCOUNTER — Emergency Department
Admission: EM | Admit: 2023-05-20 | Discharge: 2023-05-20 | Disposition: A | Discharge status: Home / Self Care | Payer: 59 | Attending: Emergency Medicine | Admitting: Emergency Medicine

## 2023-05-20 ENCOUNTER — Other Ambulatory Visit: Payer: Self-pay

## 2023-05-20 DIAGNOSIS — R112 Nausea with vomiting, unspecified: Secondary | ICD-10-CM | POA: Diagnosis not present

## 2023-05-20 DIAGNOSIS — R0989 Other specified symptoms and signs involving the circulatory and respiratory systems: Secondary | ICD-10-CM | POA: Diagnosis not present

## 2023-05-20 DIAGNOSIS — R059 Cough, unspecified: Secondary | ICD-10-CM | POA: Diagnosis not present

## 2023-05-20 DIAGNOSIS — B349 Viral infection, unspecified: Secondary | ICD-10-CM | POA: Diagnosis not present

## 2023-05-20 DIAGNOSIS — Z20822 Contact with and (suspected) exposure to covid-19: Secondary | ICD-10-CM | POA: Insufficient documentation

## 2023-05-20 DIAGNOSIS — R110 Nausea: Secondary | ICD-10-CM

## 2023-05-20 DIAGNOSIS — R519 Headache, unspecified: Secondary | ICD-10-CM | POA: Diagnosis present

## 2023-05-20 DIAGNOSIS — R42 Dizziness and giddiness: Secondary | ICD-10-CM | POA: Diagnosis not present

## 2023-05-20 LAB — CBC WITH DIFFERENTIAL/PLATELET
Abs Immature Granulocytes: 0.04 10*3/uL (ref 0.00–0.07)
Basophils Absolute: 0.1 10*3/uL (ref 0.0–0.1)
Basophils Relative: 0 %
Eosinophils Absolute: 0.2 10*3/uL (ref 0.0–0.5)
Eosinophils Relative: 2 %
HCT: 42.2 % (ref 36.0–46.0)
Hemoglobin: 13.7 g/dL (ref 12.0–15.0)
Immature Granulocytes: 0 %
Lymphocytes Relative: 33 %
Lymphs Abs: 4.3 10*3/uL — ABNORMAL HIGH (ref 0.7–4.0)
MCH: 28.8 pg (ref 26.0–34.0)
MCHC: 32.5 g/dL (ref 30.0–36.0)
MCV: 88.7 fL (ref 80.0–100.0)
Monocytes Absolute: 0.8 10*3/uL (ref 0.1–1.0)
Monocytes Relative: 6 %
Neutro Abs: 7.5 10*3/uL (ref 1.7–7.7)
Neutrophils Relative %: 59 %
Platelets: 327 10*3/uL (ref 150–400)
RBC: 4.76 MIL/uL (ref 3.87–5.11)
RDW: 13.7 % (ref 11.5–15.5)
WBC: 12.9 10*3/uL — ABNORMAL HIGH (ref 4.0–10.5)
nRBC: 0 % (ref 0.0–0.2)

## 2023-05-20 LAB — COMPREHENSIVE METABOLIC PANEL
ALT: 25 U/L (ref 0–44)
AST: 14 U/L — ABNORMAL LOW (ref 15–41)
Albumin: 3.7 g/dL (ref 3.5–5.0)
Alkaline Phosphatase: 90 U/L (ref 38–126)
Anion gap: 7 (ref 5–15)
BUN: 17 mg/dL (ref 6–20)
CO2: 24 mmol/L (ref 22–32)
Calcium: 9 mg/dL (ref 8.9–10.3)
Chloride: 106 mmol/L (ref 98–111)
Creatinine, Ser: 0.65 mg/dL (ref 0.44–1.00)
GFR, Estimated: >60 mL/min (ref >60)
Glucose, Bld: 92 mg/dL (ref 70–99)
Potassium: 4 mmol/L (ref 3.5–5.1)
Sodium: 137 mmol/L (ref 135–145)
Total Bilirubin: 0.3 mg/dL (ref 0.3–1.2)
Total Protein: 6.9 g/dL (ref 6.5–8.1)

## 2023-05-20 LAB — POC URINE PREG, ED: Preg Test, Ur: NEGATIVE

## 2023-05-20 LAB — TROPONIN I (HIGH SENSITIVITY): Troponin I (High Sensitivity): 2 ng/L (ref <18)

## 2023-05-20 LAB — SARS CORONAVIRUS 2 BY RT PCR: SARS Coronavirus 2 by RT PCR: NEGATIVE

## 2023-05-20 LAB — CBG MONITORING, ED: Glucose-Capillary: 90 mg/dL (ref 70–99)

## 2023-05-20 MED ORDER — ONDANSETRON 4 MG PO TBDP
4.0000 mg | ORAL_TABLET | Freq: Three times a day (TID) | ORAL | 0 refills | Status: DC | PRN
  Filled 2023-05-20: qty 20, 7d supply, fill #0

## 2023-05-20 MED ORDER — ONDANSETRON 4 MG PO TBDP
4.0000 mg | ORAL_TABLET | Freq: Once | ORAL | Status: AC
  Administered 2023-05-20: 4 mg via ORAL
  Filled 2023-05-20: qty 1

## 2023-05-20 NOTE — ED Provider Notes (Signed)
[facility] Provider Note    Event Date/Time   First MD Initiated Contact with Patient 05/20/23 [phone]     (approximate)   History   Headache   HPI  Allanna Bresee is a 27 y.o. female who presents to the emergency department today because of concerns for headache, nausea, diarrhea.  Patient states that she first started feeling some headache around 2:30 AM as she was getting ready to go to bed.  She did try taking some over-the-counter pain medication.  When she woke up this morning her symptoms persisted.  The patient denies any unusual activity yesterday. 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      Physical Exam   Triage Vital Signs: ED Triage Vitals  Encounter Vitals Group     BP 05/20/23 0626 (!) 129/93     Systolic BP Percentile --      Diastolic BP Percentile --      Pulse Rate 05/20/23 0626 82     Resp 05/20/23 0626 20     Temp 05/20/23 0626 98.5 F (36.9 C)     Temp Source 05/20/23 0626 Oral     SpO2 05/20/23 0626 100 %     Weight 05/20/23 0627 256 lb (116.1 kg)     Height 05/20/23 0627 5\' 5"  (1.651 m)     Head Circumference --      Peak Flow --      Pain Score 05/20/23 0627 0     Pain Loc --      Pain Education --      Exclude from Growth Chart --     Most recent vital signs: Vitals:   05/20/23 0626  BP: (!) 129/93  Pulse: 82  Resp: 20  Temp: 98.5 F (36.9 C)  SpO2: 100%   General: Awake, alert, oriented. CV:  Good peripheral perfusion. Regular rate and rhythm. Resp:  Normal effort. Lungs clear. Abd:  No distention. Non tender.   ED Results / Procedures / Treatments   Labs (all labs ordered are listed, but only abnormal results are displayed) Labs Reviewed  CBC WITH DIFFERENTIAL/PLATELET - Abnormal; Notable for the following components:      Result Value   WBC 12.9 (*)    Lymphs Abs 4.3 (*)    All other components within normal limits  COMPREHENSIVE METABOLIC PANEL - Abnormal; Notable for the  following components:   AST 14 (*)    All other components within normal limits  SARS CORONAVIRUS 2 BY RT PCR  POC URINE PREG, ED  TROPONIN I (HIGH SENSITIVITY)     EKG  I, Phineas Semen, attending physician, personally viewed and interpreted this EKG  EKG Time: 0631 Rate: 78 Rhythm: normal sinus rhythm Axis: normal Intervals: qtc 433 QRS: narrow ST changes: no st elevation Impression: normal ekg   RADIOLOGY I independently interpreted and visualized the CXR. My interpretation: No pneumonia Radiology interpretation:  IMPRESSION:  1. Low lung volumes without radiographic evidence of acute  cardiopulmonary disease.      PROCEDURES:  Critical Care performed: No   MEDICATIONS ORDERED IN ED: Medications - No data to display   IMPRESSION / MDM / ASSESSMENT AND PLAN / ED COURSE  I reviewed the triage vital signs and the nursing notes.                              Differential diagnosis includes, but is  not limited to, viral infection, gastroenteritis, covid  Patient's presentation is most consistent with acute presentation with potential threat to life or bodily function.  Patient presented to the emergency department today because of concern for headache, dizziness, nausea that started earlier this morning. On exam patient awake, alert, no abdominal pain. Blood work with mild leukocytosis, no concerning electrolyte abnormalities. Troponin negative, COVID negative. Patient was given zofran and felt improvement. Was able to tolerate PO. At this time do wonder if patient is suffering from viral illness. Will plan on discharging, will give prescription for nausea medication.     FINAL CLINICAL IMPRESSION(S) / ED DIAGNOSES   Final diagnoses:  Nausea  Viral syndrome     Note:  This document was prepared using Dragon voice recognition software and may include unintentional dictation errors.    Phineas Semen, MD 05/20/23 [phone]

## 2023-05-20 NOTE — ED Triage Notes (Signed)
Pt arrives via POV for CC of headache with dizziness that started at 0230 this am. Pt went back to sleep until approx 0530 when pt woke up with episodes of nausea/vomiting and diarrhea. Pt A&O with steady gait and speaking in complete sentences at this time.

## 2023-05-30 ENCOUNTER — Other Ambulatory Visit: Payer: Self-pay

## 2023-07-18 ENCOUNTER — Other Ambulatory Visit: Payer: Self-pay

## 2023-07-18 ENCOUNTER — Emergency Department
Admission: EM | Admit: 2023-07-18 | Discharge: 2023-07-18 | Disposition: A | Discharge status: Home / Self Care | Payer: 59 | Attending: Emergency Medicine | Admitting: Emergency Medicine

## 2023-07-18 ENCOUNTER — Encounter: Payer: Self-pay | Admitting: Radiology

## 2023-07-18 ENCOUNTER — Emergency Department: Payer: 59

## 2023-07-18 DIAGNOSIS — R110 Nausea: Secondary | ICD-10-CM | POA: Diagnosis not present

## 2023-07-18 DIAGNOSIS — Z20822 Contact with and (suspected) exposure to covid-19: Secondary | ICD-10-CM | POA: Insufficient documentation

## 2023-07-18 DIAGNOSIS — R509 Fever, unspecified: Secondary | ICD-10-CM | POA: Diagnosis present

## 2023-07-18 DIAGNOSIS — J209 Acute bronchitis, unspecified: Secondary | ICD-10-CM | POA: Diagnosis not present

## 2023-07-18 DIAGNOSIS — R059 Cough, unspecified: Secondary | ICD-10-CM | POA: Diagnosis not present

## 2023-07-18 DIAGNOSIS — R531 Weakness: Secondary | ICD-10-CM | POA: Diagnosis not present

## 2023-07-18 LAB — POC URINE PREG, ED: Preg Test, Ur: NEGATIVE

## 2023-07-18 LAB — URINALYSIS, ROUTINE W REFLEX MICROSCOPIC
Bilirubin Urine: NEGATIVE
Glucose, UA: NEGATIVE mg/dL
Hgb urine dipstick: NEGATIVE
Ketones, ur: NEGATIVE mg/dL
Leukocytes,Ua: NEGATIVE
Nitrite: NEGATIVE
Protein, ur: NEGATIVE mg/dL
Specific Gravity, Urine: 1.023 (ref 1.005–1.030)
pH: 6 (ref 5.0–8.0)

## 2023-07-18 LAB — COMPREHENSIVE METABOLIC PANEL
ALT: 21 U/L (ref 0–44)
AST: 17 U/L (ref 15–41)
Albumin: 3.8 g/dL (ref 3.5–5.0)
Alkaline Phosphatase: 82 U/L (ref 38–126)
Anion gap: 5 (ref 5–15)
BUN: 13 mg/dL (ref 6–20)
CO2: 26 mmol/L (ref 22–32)
Calcium: 8.6 mg/dL — ABNORMAL LOW (ref 8.9–10.3)
Chloride: 105 mmol/L (ref 98–111)
Creatinine, Ser: 0.57 mg/dL (ref 0.44–1.00)
GFR, Estimated: >60 mL/min (ref >60)
Glucose, Bld: 94 mg/dL (ref 70–99)
Potassium: 3.8 mmol/L (ref 3.5–5.1)
Sodium: 136 mmol/L (ref 135–145)
Total Bilirubin: 0.6 mg/dL (ref <1.2)
Total Protein: 7 g/dL (ref 6.5–8.1)

## 2023-07-18 LAB — RESP PANEL BY RT-PCR (RSV, FLU A&B, COVID)  RVPGX2
Influenza A by PCR: NEGATIVE
Influenza B by PCR: NEGATIVE
Resp Syncytial Virus by PCR: NEGATIVE
SARS Coronavirus 2 by RT PCR: NEGATIVE

## 2023-07-18 LAB — CBC
HCT: 41.1 % (ref 36.0–46.0)
Hemoglobin: 13.5 g/dL (ref 12.0–15.0)
MCH: 29.2 pg (ref 26.0–34.0)
MCHC: 32.8 g/dL (ref 30.0–36.0)
MCV: 89 fL (ref 80.0–100.0)
Platelets: 289 10*3/uL (ref 150–400)
RBC: 4.62 MIL/uL (ref 3.87–5.11)
RDW: 13.9 % (ref 11.5–15.5)
WBC: 9.3 10*3/uL (ref 4.0–10.5)
nRBC: 0 % (ref 0.0–0.2)

## 2023-07-18 LAB — LIPASE, BLOOD: Lipase: 23 U/L (ref 11–51)

## 2023-07-18 MED ORDER — AZITHROMYCIN 250 MG PO TABS
ORAL_TABLET | ORAL | 0 refills | Status: DC
  Filled 2023-07-18: qty 6, 5d supply, fill #0

## 2023-07-18 MED ORDER — PREDNISONE 10 MG PO TABS
ORAL_TABLET | ORAL | 0 refills | Status: AC
  Filled 2023-07-18: qty 21, 6d supply, fill #0

## 2023-07-18 MED ORDER — ALBUTEROL SULFATE HFA 108 (90 BASE) MCG/ACT IN AERS
2.0000 | INHALATION_SPRAY | Freq: Four times a day (QID) | RESPIRATORY_TRACT | 2 refills | Status: DC | PRN
  Filled 2023-07-18: qty 6.7, 25d supply, fill #0

## 2023-07-18 MED ORDER — BENZONATATE 100 MG PO CAPS
100.0000 mg | ORAL_CAPSULE | Freq: Three times a day (TID) | ORAL | 0 refills | Status: DC | PRN
  Filled 2023-07-18: qty 30, 10d supply, fill #0

## 2023-07-18 NOTE — ED Provider Notes (Signed)
[place] EMERGENCY DEPARTMENT AT [facility] Provider Note   CSN: [id number] Arrival date & time: 07/18/23  1451     History  Chief Complaint  Patient presents with   Cough    Jaime Hernandez is a 27 y.o. female presents to the emergency department for evaluation of severe cough.  She has had a cough now for 4 days.  Cough is dry nonproductive.  She has had some subjective fevers and warmth as well as chills.  She has had 1 episode of vomiting last night but no other vomiting.  No diarrhea.  She denies any abdominal pain chest pain or shortness of breath.  She does have some significant coughing with occasional wheezing/chest tightness.  She has been taken DayQuil and NyQuil  HPI     Home Medications Prior to Admission medications   Medication Sig Start Date End Date Taking? Authorizing Provider  albuterol (VENTOLIN HFA) 108 (90 Base) MCG/ACT inhaler Inhale 2 puffs into the lungs every 6 (six) hours as needed for wheezing or shortness of breath. 07/18/23  Yes Evon Slack, PA-C  azithromycin (ZITHROMAX Z-PAK) 250 MG tablet Take 2 tablets (500 mg) on  Day 1,  followed by 1 tablet (250 mg) once daily on Days 2 through 5. 07/18/23  Yes Evon Slack, PA-C  benzonatate (TESSALON PERLES) 100 MG capsule Take 1 capsule (100 mg total) by mouth 3 (three) times daily as needed for cough. 07/18/23 07/17/24 Yes Evon Slack, PA-C  predniSONE (DELTASONE) 10 MG tablet Take 1 tablet (10 mg total) by mouth daily. 6,5,4,3,2,1 six day taper 07/18/23  Yes Evon Slack, PA-C  atenolol (TENORMIN) 25 MG tablet Take 1 tablet (25 mg total) by mouth once daily 06/20/22     brompheniramine-pseudoephedrine-DM 30-2-10 MG/5ML syrup Take 10 mLs by mouth 4 (four) times daily as needed. 07/27/22   Cuthriell, Delorise Royals, PA-C  cefdinir (OMNICEF) 300 MG capsule Take 1 capsule (300 mg total) by mouth 2 (two) times daily for 10 days 07/23/22     doxycycline (VIBRAMYCIN) 100 MG capsule Take one  tablet twice daily the day before, day of, & day after procedure 04/18/23     ibuprofen (ADVIL) 800 MG tablet Take 1 tablet (800 mg total) by mouth every 8 (eight) hours as needed. 05/22/22   Ward, Layla Maw, DO  letrozole [facility]) 2.5 MG tablet Take 2 tablets (5 mg total) by mouth daily starting on day 3 of cycle until day 7. 10/18/22     letrozole (FEMARA) 2.5 MG tablet Take 3 tablets (7.5 mg total) by mouth daily for 5 days starting on day 3 of cycle until day 7. 12/04/22     meclizine (ANTIVERT) 25 MG tablet Take 1 tablet (25 mg total) by mouth 3 (three) times daily as needed for dizziness. 08/08/22   Fisher, Roselyn Bering, PA-C  meclizine (ANTIVERT) 25 MG tablet Take 1 tablet (25 mg total) by mouth 3 (three) times daily as needed for dizziness. 08/10/22   Merwyn Katos, MD  medroxyPROGESTERone (PROVERA) 10 MG tablet Take 1 tablet (10 mg total) by mouth daily for 10 days. 11/23/22     medroxyPROGESTERone (PROVERA) 10 MG tablet Take 1 tablet (10 mg total) by mouth daily for 7 days 04/24/23     meloxicam (MOBIC) 15 MG tablet Take 1 tablet (15 mg total) by mouth daily. 11/13/22 11/13/23  Fisher, Roselyn Bering, PA-C  metFORMIN (GLUCOPHAGE) 500 MG tablet Take 2 tablets (1,000 mg total) by mouth 2 (two)  times daily with a meal. 500 at bedtime for 1 wk.  Then increase to twice per day with meals as directed. 07/11/21   Linzie Collin, MD  Multiple Vitamins-Minerals (MULTIVITAL PO) Take by mouth.    [provider]  naproxen (NAPROSYN) 500 MG tablet Take 1 tablet (500 mg total) by mouth 2 (two) times daily with a meal. 02/07/23   Irean Hong, MD  ondansetron (ZOFRAN) 4 MG tablet Take 1 tablet (4 mg total) by mouth daily as needed for nausea or vomiting. 12/12/22 12/12/23  Romeo Apple, Myah A, PA-C  ondansetron (ZOFRAN-ODT) 4 MG disintegrating tablet Take 1 tablet (4 mg total) by mouth every 8 (eight) hours as needed for nausea or vomiting. 02/22/23   Willy Eddy, MD  ondansetron (ZOFRAN-ODT) 4 MG disintegrating tablet  Take 1 tablet (4 mg total) by mouth every 8 (eight) hours as needed for nausea or vomiting. 05/20/23   Phineas Semen, MD  promethazine-dextromethorphan (PROMETHAZINE-DM) 6.25-15 MG/5ML syrup Take 5 mLs by mouth every 6 (six) hours as needed for up to 7 days 07/23/22         Allergies    Acetaminophen-codeine and Clomid [clomiphene citrate]    Review of Systems   Review of Systems  Physical Exam Updated Vital Signs BP 134/77 (BP Location: Left Arm)   Pulse 75   Temp 98.5 F (36.9 C) (Oral)   Resp 18   Ht 5\' 5"  (1.651 m)   Wt 120.7 kg   SpO2 100%   BMI 44.26 kg/m  Physical Exam Constitutional:      Appearance: She is well-developed.  HENT:     Head: Normocephalic and atraumatic.     Right Ear: There is no impacted cerumen.     Left Ear: There is no impacted cerumen.     Nose: No congestion.     Mouth/Throat:     Pharynx: No oropharyngeal exudate.  Eyes:     Conjunctiva/sclera: Conjunctivae normal.  Cardiovascular:     Rate and Rhythm: Normal rate.     Pulses: Normal pulses.     Heart sounds: Normal heart sounds.  Pulmonary:     Effort: Pulmonary effort is normal. No respiratory distress.     Breath sounds: No stridor. Rhonchi present. No wheezing.  Chest:     Chest wall: No tenderness.  Abdominal:     General: There is no distension.     Tenderness: There is no abdominal tenderness. There is no right CVA tenderness or guarding.  Musculoskeletal:        General: Normal range of motion.     Cervical back: Normal range of motion.  Skin:    General: Skin is warm.     Findings: No rash.  Neurological:     Mental Status: She is alert and oriented to person, place, and time.  Psychiatric:        Behavior: Behavior normal.        Thought Content: Thought content normal.     ED Results / Procedures / Treatments   Labs (all labs ordered are listed, but only abnormal results are displayed) Labs Reviewed  COMPREHENSIVE METABOLIC PANEL - Abnormal; Notable for the  following components:      Result Value   Calcium 8.6 (*)    All other components within normal limits  URINALYSIS, ROUTINE W REFLEX MICROSCOPIC - Abnormal; Notable for the following components:   Color, Urine YELLOW (*)    APPearance HAZY (*)    Bacteria, UA RARE (*)  All other components within normal limits  RESP PANEL BY RT-PCR (RSV, FLU A&B, COVID)  RVPGX2  URINE CULTURE  LIPASE, BLOOD  CBC  POC URINE PREG, ED    EKG None  Radiology DG Chest 2 View Result Date: 07/18/2023 CLINICAL DATA:  Cough and nausea for several days.  Weakness. EXAM: CHEST - 2 VIEW COMPARISON:  05/20/2023 FINDINGS: The heart size and mediastinal contours are within normal limits. Both lungs are clear. The visualized skeletal structures are unremarkable. IMPRESSION: Normal exam. Electronically Signed   By: Danae Orleans M.D.   On: 07/18/2023 16:29    Procedures Procedures    Medications Ordered in ED Medications - No data to display  ED Course/ Medical Decision Making/ A&P                                 Medical Decision Making Amount and/or Complexity of Data Reviewed Labs: ordered. Radiology: ordered.  Risk Prescription drug management.   27 year old female with worsening cough over the last 4 to 5 days.  She has dry nonproductive cough.  She reports intermittent wheezing.  Her vital signs are currently stable afebrile nontachycardic with normal O2 sats.  Her chest x-ray showed no acute cardiopulmonary process.  No signs respiratory extremis on exam, lungs were clear to auscultation with no wheezing, slight rhonchi.  Her blood work and urinalysis all within normal limits.  Urine culture is pending due to slight elevated WBCs but likely contamination due to elevated epithelial cells. Patient treated with Z-Pak, prednisone, Tessalon Perles and albuterol inhaler.  She will take medications as prescribed she understands signs symptoms return to the ER for. Final Clinical Impression(s) / ED  Diagnoses Final diagnoses:  Acute bronchitis, unspecified organism    Rx / DC Orders ED Discharge Orders          Ordered    azithromycin (ZITHROMAX Z-PAK) 250 MG tablet        07/18/23 1706    predniSONE (DELTASONE) 10 MG tablet  Daily        07/18/23 1706    benzonatate (TESSALON PERLES) 100 MG capsule  3 times daily PRN        07/18/23 1706    albuterol (VENTOLIN HFA) 108 (90 Base) MCG/ACT inhaler  Every 6 hours PRN        07/18/23 1706              Ronnette Juniper 07/18/23 1710    Minna Antis, MD 07/28/23 1448

## 2023-07-18 NOTE — ED Triage Notes (Signed)
Pt states that she has had a cough since Sunday that now gives her a metal taste when she coughs. Pt states she has also had nausea since Sunday with one episode of emesis last night. Denies abd pain but endorses weakness and fogginess.

## 2023-07-18 NOTE — Discharge Instructions (Signed)
Please take medications as prescribed.  Return to the ER for any fevers worsening symptoms or any urgent changes in your health

## 2023-07-19 LAB — URINE CULTURE
Culture: NO GROWTH
Special Requests: NORMAL

## 2023-07-30 ENCOUNTER — Other Ambulatory Visit: Payer: Self-pay

## 2023-09-04 ENCOUNTER — Other Ambulatory Visit: Payer: Self-pay

## 2023-09-04 ENCOUNTER — Emergency Department
Admission: EM | Admit: 2023-09-04 | Discharge: 2023-09-04 | Disposition: A | Discharge status: Home / Self Care | Payer: 59 | Attending: Emergency Medicine | Admitting: Emergency Medicine

## 2023-09-04 DIAGNOSIS — R112 Nausea with vomiting, unspecified: Secondary | ICD-10-CM | POA: Insufficient documentation

## 2023-09-04 DIAGNOSIS — R197 Diarrhea, unspecified: Secondary | ICD-10-CM | POA: Diagnosis not present

## 2023-09-04 DIAGNOSIS — R1030 Lower abdominal pain, unspecified: Secondary | ICD-10-CM | POA: Diagnosis not present

## 2023-09-04 DIAGNOSIS — Z20822 Contact with and (suspected) exposure to covid-19: Secondary | ICD-10-CM | POA: Diagnosis not present

## 2023-09-04 LAB — URINALYSIS, ROUTINE W REFLEX MICROSCOPIC
Bilirubin Urine: NEGATIVE
Glucose, UA: NEGATIVE mg/dL
Hgb urine dipstick: NEGATIVE
Ketones, ur: 20 mg/dL — AB
Leukocytes,Ua: NEGATIVE
Nitrite: NEGATIVE
Protein, ur: NEGATIVE mg/dL
Specific Gravity, Urine: 1.028 (ref 1.005–1.030)
pH: 5 (ref 5.0–8.0)

## 2023-09-04 LAB — COMPREHENSIVE METABOLIC PANEL
ALT: 25 U/L (ref 0–44)
AST: 28 U/L (ref 15–41)
Albumin: 4.1 g/dL (ref 3.5–5.0)
Alkaline Phosphatase: 84 U/L (ref 38–126)
Anion gap: 17 — ABNORMAL HIGH (ref 5–15)
BUN: 16 mg/dL (ref 6–20)
CO2: 21 mmol/L — ABNORMAL LOW (ref 22–32)
Calcium: 9 mg/dL (ref 8.9–10.3)
Chloride: 103 mmol/L (ref 98–111)
Creatinine, Ser: 0.81 mg/dL (ref 0.44–1.00)
GFR, Estimated: >60 mL/min (ref >60)
Glucose, Bld: 119 mg/dL — ABNORMAL HIGH (ref 70–99)
Potassium: 3.8 mmol/L (ref 3.5–5.1)
Sodium: 141 mmol/L (ref 135–145)
Total Bilirubin: 0.8 mg/dL (ref 0.0–1.2)
Total Protein: 7.6 g/dL (ref 6.5–8.1)

## 2023-09-04 LAB — CBC
HCT: 42.8 % (ref 36.0–46.0)
Hemoglobin: 13.8 g/dL (ref 12.0–15.0)
MCH: 28.6 pg (ref 26.0–34.0)
MCHC: 32.2 g/dL (ref 30.0–36.0)
MCV: 88.6 fL (ref 80.0–100.0)
Platelets: 296 10*3/uL (ref 150–400)
RBC: 4.83 MIL/uL (ref 3.87–5.11)
RDW: 13.6 % (ref 11.5–15.5)
WBC: 17.1 10*3/uL — ABNORMAL HIGH (ref 4.0–10.5)
nRBC: 0 % (ref 0.0–0.2)

## 2023-09-04 LAB — RESP PANEL BY RT-PCR (RSV, FLU A&B, COVID)  RVPGX2
Influenza A by PCR: NEGATIVE
Influenza B by PCR: NEGATIVE
Resp Syncytial Virus by PCR: NEGATIVE
SARS Coronavirus 2 by RT PCR: NEGATIVE

## 2023-09-04 LAB — POC URINE PREG, ED: Preg Test, Ur: NEGATIVE

## 2023-09-04 LAB — LIPASE, BLOOD: Lipase: 23 U/L (ref 11–51)

## 2023-09-04 MED ORDER — LOPERAMIDE HCL 2 MG PO CAPS
4.0000 mg | ORAL_CAPSULE | Freq: Once | ORAL | Status: AC
  Administered 2023-09-04: 4 mg via ORAL
  Filled 2023-09-04: qty 2

## 2023-09-04 MED ORDER — ONDANSETRON 4 MG PO TBDP
4.0000 mg | ORAL_TABLET | Freq: Once | ORAL | Status: AC
  Administered 2023-09-04: 4 mg via ORAL
  Filled 2023-09-04: qty 1

## 2023-09-04 MED ORDER — LACTATED RINGERS IV BOLUS
1000.0000 mL | Freq: Once | INTRAVENOUS | Status: AC
  Administered 2023-09-04: 1000 mL via INTRAVENOUS

## 2023-09-04 MED ORDER — SODIUM CHLORIDE 0.9 % IV SOLN
12.5000 mg | Freq: Once | INTRAVENOUS | Status: AC
  Administered 2023-09-04: 12.5 mg via INTRAVENOUS
  Filled 2023-09-04: qty 12.5

## 2023-09-04 MED ORDER — ONDANSETRON 4 MG PO TBDP
4.0000 mg | ORAL_TABLET | Freq: Once | ORAL | Status: AC | PRN
  Administered 2023-09-04: 4 mg via ORAL
  Filled 2023-09-04: qty 1

## 2023-09-04 MED ORDER — ONDANSETRON 8 MG PO TBDP
8.0000 mg | ORAL_TABLET | Freq: Three times a day (TID) | ORAL | 0 refills | Status: DC | PRN
  Filled 2023-09-04: qty 20, 7d supply, fill #0

## 2023-09-04 MED ORDER — PROMETHAZINE HCL 12.5 MG PO TABS
12.5000 mg | ORAL_TABLET | Freq: Four times a day (QID) | ORAL | 0 refills | Status: DC | PRN
  Filled 2023-09-04: qty 30, 8d supply, fill #0

## 2023-09-04 NOTE — ED Notes (Signed)
Patietn has gone to br x 2 for diarrhea and has vomited.  Waiting for phenergan and will give than and wait a bit to give the loperimide.  Explained to patient.

## 2023-09-04 NOTE — ED Provider Triage Note (Signed)
Emergency Medicine Provider Triage Evaluation Note  Zaharah Amir , a 28 y.o. female  was evaluated in triage.  Pt complains of v/d, hx pcos, sx started about 2 am.  Review of Systems  Positive:  Negative:   Physical Exam  BP 124/71 (BP Location: Right Arm)   Pulse (!) 108   Temp 99.2 F (37.3 C) (Oral)   Resp 18   Ht 5\' 5"  (1.651 m)   Wt 119.7 kg   SpO2 97%   BMI 43.93 kg/m  Gen:   Awake, no distress   Resp:  Normal effort  MSK:   Moves extremities without difficulty  Other:    Medical Decision Making  Medically screening exam initiated at 8:53 AM.  Appropriate orders placed.  Shamieka Gullo was informed that the remainder of the evaluation will be completed by another provider, this initial triage assessment does not replace that evaluation, and the importance of remaining in the ED until their evaluation is complete.     Faythe Ghee, PA-C 09/04/23 [phone]

## 2023-09-04 NOTE — ED Notes (Signed)
Pateint is laying on recliner with eyes closed.

## 2023-09-04 NOTE — ED Notes (Signed)
Up to br to get cleaned up.  Had diarrhea again.  Given paper pants to wear.

## 2023-09-04 NOTE — ED Provider Notes (Signed)
[facility] Provider Note   Event Date/Time   First MD Initiated Contact with Patient 09/04/23 [phone]     (approximate) History  Abdominal Pain  HPI Jaime Hernandez is a 28 y.o. female with no stated past medical history who presents complaining of nausea/vomiting/diarrhea with accompanying lower abdominal pain over the last 48 hours.  Patient denies any right upper quadrant pain.  Patient denies any previous abdominal surgeries.  Patient denies any recent travel or sick contacts. ROS: Patient currently denies any vision changes, tinnitus, difficulty speaking, facial droop, sore throat, chest pain, shortness of breath, abdominal pain, nausea/vomiting/diarrhea, dysuria, or weakness/numbness/paresthesias in any extremity   Physical Exam  Triage Vital Signs: ED Triage Vitals  Encounter Vitals Group     BP 09/04/23 0552 124/71     Systolic BP Percentile --      Diastolic BP Percentile --      Pulse Rate 09/04/23 0552 (!) 108     Resp 09/04/23 0552 18     Temp 09/04/23 0552 99.2 F (37.3 C)     Temp Source 09/04/23 0552 Oral     SpO2 09/04/23 0552 97 %     Weight 09/04/23 0406 264 lb (119.7 kg)     Height 09/04/23 0406 5\' 5"  (1.651 m)     Head Circumference --      Peak Flow --      Pain Score 09/04/23 0406 6     Pain Loc --      Pain Education --      Exclude from Growth Chart --    Most recent vital signs: Vitals:   09/04/23 1106 09/04/23 1454  BP:    Pulse: (!) 111   Resp:  14  Temp:    SpO2:     General: Awake, oriented x4. CV:  Good peripheral perfusion.  Resp:  Normal effort.  Abd:  No distention.  Other:  Young adult obese Caucasian female resting comfortably in no acute distress ED Results / Procedures / Treatments  Labs (all labs ordered are listed, but only abnormal results are displayed) Labs Reviewed  COMPREHENSIVE METABOLIC PANEL - Abnormal; Notable for the following components:      Result Value   CO2 21 (*)    Glucose, Bld 119 (*)     Anion gap 17 (*)    All other components within normal limits  CBC - Abnormal; Notable for the following components:   WBC 17.1 (*)    All other components within normal limits  URINALYSIS, ROUTINE W REFLEX MICROSCOPIC - Abnormal; Notable for the following components:   Color, Urine YELLOW (*)    APPearance HAZY (*)    Ketones, ur 20 (*)    All other components within normal limits  RESP PANEL BY RT-PCR (RSV, FLU A&B, COVID)  RVPGX2  LIPASE, BLOOD  HCG, QUANTITATIVE, PREGNANCY  POC URINE PREG, ED   PROCEDURES: Critical Care performed: No Procedures MEDICATIONS ORDERED IN ED: Medications  ondansetron (ZOFRAN-ODT) disintegrating tablet 4 mg (4 mg Oral Given 09/04/23 0608)  ondansetron (ZOFRAN-ODT) disintegrating tablet 4 mg (4 mg Oral Given 09/04/23 1008)  lactated ringers bolus 1,000 mL (0 mLs Intravenous Stopped 09/04/23 1503)  promethazine (PHENERGAN) 12.5 mg in sodium chloride 0.9 % 50 mL IVPB (0 mg Intravenous Stopped 09/04/23 1538)  loperamide (IMODIUM) capsule 4 mg (4 mg Oral Given 09/04/23 1435)   IMPRESSION / MDM / ASSESSMENT AND PLAN / ED COURSE  I reviewed the triage vital signs and the  nursing notes.                             The patient is on the cardiac monitor to evaluate for evidence of arrhythmia and/or significant heart rate changes. Patient's presentation is most consistent with acute presentation with potential threat to life or bodily function. Patient presents for acute nausea/vomiting The cause of the patient's symptoms is not clear, but the patient is overall well appearing and is suspected to have a transient course of illness.  Given History and Exam there does not appear to be an emergent cause of the symptoms such as small bowel obstruction, coronary syndrome, bowel ischemia, DKA, pancreatitis, appendicitis, other acute abdomen or other emergent problem.  Reassessment: After treatment, the patient is feeling much better, tolerating PO fluids, and shows  no signs of dehydration.   Disposition: Discharge home with prompt primary care physician follow up in the next 48 hours. Strict return precautions discussed.   FINAL CLINICAL IMPRESSION(S) / ED DIAGNOSES   Final diagnoses:  Lower abdominal pain  Nausea vomiting and diarrhea   Rx / DC Orders   ED Discharge Orders          Ordered    ondansetron (ZOFRAN-ODT) 8 MG disintegrating tablet  Every 8 hours PRN        09/04/23 1124    promethazine (PHENERGAN) 12.5 MG tablet  Every 6 hours PRN        09/04/23 1528           Note:  This document was prepared using Dragon voice recognition software and may include unintentional dictation errors.   Merwyn Katos, MD 09/04/23 [phone]

## 2023-09-04 NOTE — ED Triage Notes (Signed)
Pt to ED via POV c/o abd pain, N/V/D. Started this morning 2am. Denies any sick contacts.

## 2024-01-10 ENCOUNTER — Emergency Department

## 2024-01-10 ENCOUNTER — Other Ambulatory Visit: Payer: Self-pay

## 2024-01-10 ENCOUNTER — Emergency Department
Admission: EM | Admit: 2024-01-10 | Discharge: 2024-01-10 | Disposition: A | Discharge status: Home / Self Care | Attending: Emergency Medicine | Admitting: Emergency Medicine

## 2024-01-10 DIAGNOSIS — R109 Unspecified abdominal pain: Secondary | ICD-10-CM

## 2024-01-10 DIAGNOSIS — R42 Dizziness and giddiness: Secondary | ICD-10-CM | POA: Insufficient documentation

## 2024-01-10 DIAGNOSIS — R1030 Lower abdominal pain, unspecified: Secondary | ICD-10-CM | POA: Diagnosis not present

## 2024-01-10 DIAGNOSIS — N200 Calculus of kidney: Secondary | ICD-10-CM | POA: Diagnosis not present

## 2024-01-10 DIAGNOSIS — R1031 Right lower quadrant pain: Secondary | ICD-10-CM | POA: Diagnosis not present

## 2024-01-10 LAB — CBC
HCT: 41.4 % (ref 36.0–46.0)
Hemoglobin: 13.5 g/dL (ref 12.0–15.0)
MCH: 28.9 pg (ref 26.0–34.0)
MCHC: 32.6 g/dL (ref 30.0–36.0)
MCV: 88.7 fL (ref 80.0–100.0)
Platelets: 300 10*3/uL (ref 150–400)
RBC: 4.67 MIL/uL (ref 3.87–5.11)
RDW: 13.1 % (ref 11.5–15.5)
WBC: 10.4 10*3/uL (ref 4.0–10.5)
nRBC: 0 % (ref 0.0–0.2)

## 2024-01-10 LAB — COMPREHENSIVE METABOLIC PANEL WITH GFR
ALT: 24 U/L (ref 0–44)
AST: 21 U/L (ref 15–41)
Albumin: 3.5 g/dL (ref 3.5–5.0)
Alkaline Phosphatase: 73 U/L (ref 38–126)
Anion gap: 11 (ref 5–15)
BUN: 10 mg/dL (ref 6–20)
CO2: 20 mmol/L — ABNORMAL LOW (ref 22–32)
Calcium: 8.8 mg/dL — ABNORMAL LOW (ref 8.9–10.3)
Chloride: 105 mmol/L (ref 98–111)
Creatinine, Ser: 0.77 mg/dL (ref 0.44–1.00)
GFR, Estimated: >60 mL/min (ref >60)
Glucose, Bld: 122 mg/dL — ABNORMAL HIGH (ref 70–99)
Potassium: 3.8 mmol/L (ref 3.5–5.1)
Sodium: 136 mmol/L (ref 135–145)
Total Bilirubin: 0.4 mg/dL (ref 0.0–1.2)
Total Protein: 6.8 g/dL (ref 6.5–8.1)

## 2024-01-10 LAB — URINALYSIS, ROUTINE W REFLEX MICROSCOPIC
Bilirubin Urine: NEGATIVE
Glucose, UA: NEGATIVE mg/dL
Hgb urine dipstick: NEGATIVE
Ketones, ur: NEGATIVE mg/dL
Leukocytes,Ua: NEGATIVE
Nitrite: NEGATIVE
Protein, ur: NEGATIVE mg/dL
Specific Gravity, Urine: 1.013 (ref 1.005–1.030)
pH: 6 (ref 5.0–8.0)

## 2024-01-10 LAB — POC URINE PREG, ED: Preg Test, Ur: NEGATIVE

## 2024-01-10 MED ORDER — ONDANSETRON 4 MG PO TBDP
4.0000 mg | ORAL_TABLET | Freq: Three times a day (TID) | ORAL | 0 refills | Status: DC | PRN
  Filled 2024-01-10: qty 20, 7d supply, fill #0

## 2024-01-10 MED ORDER — SODIUM CHLORIDE 0.9 % IV BOLUS
1000.0000 mL | Freq: Once | INTRAVENOUS | Status: AC
  Administered 2024-01-10: 1000 mL via INTRAVENOUS

## 2024-01-10 MED ORDER — ONDANSETRON HCL 4 MG/2ML IJ SOLN
4.0000 mg | Freq: Once | INTRAMUSCULAR | Status: AC
  Administered 2024-01-10: 4 mg via INTRAVENOUS
  Filled 2024-01-10: qty 2

## 2024-01-10 MED ORDER — IOHEXOL 300 MG/ML  SOLN
100.0000 mL | Freq: Once | INTRAMUSCULAR | Status: AC | PRN
  Administered 2024-01-10: 100 mL via INTRAVENOUS

## 2024-01-10 NOTE — Discharge Instructions (Signed)
 Follow-up with your regular doctor if not improving in 2 to 3 days.  Return emergency department if worsening right lower quadrant pain.  Drink plenty of fluids.  Take the Zofran  for nausea as needed.

## 2024-01-10 NOTE — ED Provider Notes (Signed)
 [facility] Provider Note    Event Date/Time   First MD Initiated Contact with Patient 01/10/24 1936     (approximate)   History   Dizziness   HPI  Jaime Hernandez is a 28 y.o. female history of PCOS, obesity presents emergency department with dizziness, nausea, cough that started last Saturday.  Patient states that she is having some lower abdominal pain after having a lot of diarrhea.  States she also had some vaginal spotting and is unsure if she is pregnant.  States continues to feel nauseated even though the diarrhea has stopped.  No dysuria.  No fever or chills.      Physical Exam   Triage Vital Signs: ED Triage Vitals  Encounter Vitals Group     BP 01/10/24 1852 122/88     Systolic BP Percentile --      Diastolic BP Percentile --      Pulse Rate 01/10/24 1851 80     Resp 01/10/24 1851 16     Temp 01/10/24 1851 98.4 F (36.9 C)     Temp Source 01/10/24 1851 Oral     SpO2 01/10/24 1851 98 %     Weight --      Height --      Head Circumference --      Peak Flow --      Pain Score --      Pain Loc --      Pain Education --      Exclude from Growth Chart --     Most recent vital signs: Vitals:   01/10/24 1852 01/10/24 2155  BP: 122/88 128/85  Pulse:  83  Resp:  18  Temp:    SpO2:  99%     General: Awake, no distress.   CV:  Good peripheral perfusion. regular rate and  rhythm Resp:  Normal effort. Lungs cta Abd:  No distention.  Tender right lower quadrant Other:      ED Results / Procedures / Treatments   Labs (all labs ordered are listed, but only abnormal results are displayed) Labs Reviewed  COMPREHENSIVE METABOLIC PANEL WITH GFR - Abnormal; Notable for the following components:      Result Value   CO2 20 (*)    Glucose, Bld 122 (*)    Calcium 8.8 (*)    All other components within normal limits  URINALYSIS, ROUTINE W REFLEX MICROSCOPIC - Abnormal; Notable for the following components:   Color, Urine STRAW (*)     APPearance CLEAR (*)    All other components within normal limits  CBC  POC URINE PREG, ED  CBG MONITORING, ED     EKG     RADIOLOGY CT abdomen pelvis IV contrast    PROCEDURES:   Procedures  Critical Care: No Chief Complaint  Patient presents with   Dizziness      MEDICATIONS ORDERED IN ED: Medications  sodium chloride  0.9 % bolus 1,000 mL (0 mLs Intravenous Stopped 01/10/24 2155)  ondansetron  (ZOFRAN ) injection 4 mg (4 mg Intravenous Given 01/10/24 2044)  iohexol  (OMNIPAQUE ) 300 MG/ML solution 100 mL (100 mLs Intravenous Contrast Given 01/10/24 2116)     IMPRESSION / MDM / ASSESSMENT AND PLAN / ED COURSE  I reviewed the triage vital signs and the nursing notes.                              Differential diagnosis includes, but  is not limited to, pregnancy, miscarriage, ovarian cyst, acute appendicitis, gastroenteritis, dehydration  Patient's presentation is most consistent with acute illness / injury with system symptoms.   Cardiac monitor no Medications given: Zofran  4 mg IV, normal saline 1 L IV  Labs ordered, if patient is pregnant will not do CT imaging, however we will do ultrasound at that time   POC pregnancy negative, labs reassuring  CT abdomen pelvis IV contrast, did independently review and interpret radiologist reading as being negative for any acute abnormality.  Did explain the findings to the patient.  Feels some of her discomfort is her normal PCOS.  Do not see any sign of infection to warrant antibiotic at this time.  Feels some of the dizziness may be due to her dehydration from so much diarrhea last week.  Should be corrected with the fluids we gave her.  States she is feeling better.  Prescription for Zofran  ODT was sent to her pharmacy.  Work note provided for today.  She was discharged stable condition.  Return if worsening.   FINAL CLINICAL IMPRESSION(S) / ED DIAGNOSES   Final diagnoses:  Dizziness  Acute abdominal pain     Rx / DC  Orders   ED Discharge Orders          Ordered    ondansetron  (ZOFRAN -ODT) 4 MG disintegrating tablet  Every 8 hours PRN        01/10/24 2141             Note:  This document was prepared using Dragon voice recognition software and may include unintentional dictation errors.    Delsie Figures, PA-C 01/10/24 2209    Lind Repine, MD 01/10/24 [phone]

## 2024-01-10 NOTE — ED Triage Notes (Addendum)
 C/o dizziness, nausea, cough that started since Saturday. Pt reports intermittent heart palpations yesterday. GCS 15.

## 2024-01-12 ENCOUNTER — Other Ambulatory Visit: Payer: Self-pay

## 2024-01-13 ENCOUNTER — Other Ambulatory Visit: Payer: Self-pay

## 2024-01-19 ENCOUNTER — Other Ambulatory Visit: Payer: Self-pay

## 2024-01-19 ENCOUNTER — Emergency Department
Admission: EM | Admit: 2024-01-19 | Discharge: 2024-01-19 | Disposition: A | Discharge status: Home / Self Care | Attending: Emergency Medicine | Admitting: Emergency Medicine

## 2024-01-19 DIAGNOSIS — J45909 Unspecified asthma, uncomplicated: Secondary | ICD-10-CM | POA: Diagnosis not present

## 2024-01-19 DIAGNOSIS — R0600 Dyspnea, unspecified: Secondary | ICD-10-CM | POA: Insufficient documentation

## 2024-01-19 DIAGNOSIS — R519 Headache, unspecified: Secondary | ICD-10-CM | POA: Diagnosis not present

## 2024-01-19 DIAGNOSIS — R059 Cough, unspecified: Secondary | ICD-10-CM | POA: Diagnosis present

## 2024-01-19 DIAGNOSIS — R0981 Nasal congestion: Secondary | ICD-10-CM | POA: Insufficient documentation

## 2024-01-19 DIAGNOSIS — R051 Acute cough: Secondary | ICD-10-CM | POA: Insufficient documentation

## 2024-01-19 LAB — RESP PANEL BY RT-PCR (RSV, FLU A&B, COVID)  RVPGX2
Influenza A by PCR: NEGATIVE
Influenza B by PCR: NEGATIVE
Resp Syncytial Virus by PCR: NEGATIVE
SARS Coronavirus 2 by RT PCR: NEGATIVE

## 2024-01-19 MED ORDER — ALBUTEROL SULFATE HFA 108 (90 BASE) MCG/ACT IN AERS
2.0000 | INHALATION_SPRAY | Freq: Four times a day (QID) | RESPIRATORY_TRACT | 2 refills | Status: AC | PRN
  Filled 2024-01-19: qty 6.7, 25d supply, fill #0

## 2024-01-19 MED ORDER — FLUTICASONE PROPIONATE 50 MCG/ACT NA SUSP
2.0000 | NASAL | 0 refills | Status: DC | PRN
  Filled 2024-01-19: qty 16, 30d supply, fill #0

## 2024-01-19 MED ORDER — BENZONATATE 100 MG PO CAPS
100.0000 mg | ORAL_CAPSULE | Freq: Two times a day (BID) | ORAL | 0 refills | Status: AC | PRN
Start: 2024-01-19 — End: 2024-02-03
  Filled 2024-01-19: qty 30, 15d supply, fill #0

## 2024-01-19 MED ORDER — IPRATROPIUM-ALBUTEROL 0.5-2.5 (3) MG/3ML IN SOLN
3.0000 mL | Freq: Once | RESPIRATORY_TRACT | Status: AC
  Administered 2024-01-19: 3 mL via RESPIRATORY_TRACT
  Filled 2024-01-19: qty 3

## 2024-01-19 NOTE — ED Notes (Signed)
 PT experiencing dry cough and sinus pressure.

## 2024-01-19 NOTE — ED Notes (Signed)
 Pt stated that breathing treatment was helpful in opening up her nasal passages.

## 2024-01-19 NOTE — Discharge Instructions (Signed)
 You were seen in the emergency room today for cough and sinus pressure.  You do not have COVID, flu, or RSV.  This cough may be due to your asthma or another viral upper respiratory infection.  Please pick up the medications and take them as prescribed.  Please return to the emergency department if you experience chest pain, shortness of breath, difficulty breathing, or fever not reducing with Tylenol  and/or ibuprofen  usage.  Please follow-up with your primary care provider, urgent care as needed, if symptoms worsen or for any new or concerning symptoms.

## 2024-01-19 NOTE — ED Triage Notes (Signed)
 Pt reports congestion, nasal congestion, cough, sinus pressure since Friday. Pt talks in complete in sentences no respiratory distress noted

## 2024-01-19 NOTE — ED Provider Notes (Signed)
 [facility] Provider Note    Event Date/Time   First MD Initiated Contact with Patient 01/19/24 1936     (approximate)   History   Facial Pain   HPI  Jaime Hernandez is a 28 y.o. female presenting to the emergency department with nonproductive cough, congestion, and sinus pressure since Friday.  Patient also reports headache that she has been treating with ibuprofen , and that has been helping. She also endorses some difficulty breathing at times. Denies fever, chest pain, abdominal pain, nausea, vomiting, diarrhea.  She is employed at [employer] and comes in contact with sick patients frequently.  She states no one is sick at home.  She does have a past medical history of asthma as a child, but does not currently have her using inhaler.  Also has past medical history of PCOS.     Physical Exam   Triage Vital Signs: ED Triage Vitals  Encounter Vitals Group     BP 01/19/24 1929 (!) 123/90     Girls Systolic BP Percentile --      Girls Diastolic BP Percentile --      Boys Systolic BP Percentile --      Boys Diastolic BP Percentile --      Pulse Rate 01/19/24 1929 86     Resp 01/19/24 1929 16     Temp 01/19/24 1929 98.4 F (36.9 C)     Temp Source 01/19/24 1929 Oral     SpO2 01/19/24 1929 100 %     Weight 01/19/24 1930 260 lb (117.9 kg)     Height 01/19/24 1930 5' 5 (1.651 m)     Head Circumference --      Peak Flow --      Pain Score 01/19/24 1930 0     Pain Loc --      Pain Education --      Exclude from Growth Chart --     Most recent vital signs: Vitals:   01/19/24 1929  BP: (!) 123/90  Pulse: 86  Resp: 16  Temp: 98.4 F (36.9 C)  SpO2: 100%   General: Well-appearing, in no acute distress. Appears stated age. Coughing in room. Head: Normocephalic, atraumatic. Eyes: PERRLA. EOMs intact. No scleral icterus or conjunctival injection. Ears/Nose/Throat: TMs intact b/l. Nares patent, no nasal discharge; internal nostrils erythematous bilaterally,  but no polyps. Oropharynx moist, no erythema or exudate. Dentition intact. CV: Regular rate, 86 bpm. Peripheral pulses 2+ and symmetric. No edema. Respiratory: Breath sounds clear b/l. No wheezes, rales, or rhonchi. No respiratory distress. Normal respiratory effort. GI: Soft, non-distended, non-tender. No rebound or guarding.  Skin:Warm, dry, intact. No rashes, lesions, or ecchymosis. No cyanosis or pallor. Neurological: A&Ox4 to person, place, time, and situation.  Tender to palpation in frontal and ethmoid sinuses. ED Results / Procedures / Treatments   Labs (all labs ordered are listed, but only abnormal results are displayed) Labs Reviewed  RESP PANEL BY RT-PCR (RSV, FLU A&B, COVID)  [id number]     EKG    RADIOLOGY    PROCEDURES:  Critical Care performed: No  Procedures   MEDICATIONS ORDERED IN ED: Medications  ipratropium-albuterol  (DUONEB) 0.5-2.5 (3) MG/3ML nebulizer solution 3 mL (3 mLs Nebulization Given 01/19/24 2010)     IMPRESSION / MDM / ASSESSMENT AND PLAN / ED COURSE  I reviewed the triage vital signs and the nursing notes.  Differential diagnosis includes, but is not limited to, other viral URI, asthma exacerbation, sinusitis, allergic rhinitis, COVID, influenza, RSV  Patient's presentation is most consistent with acute complicated illness / injury requiring diagnostic workup.  Patient is a 28 year old female who presented with cough, headache, and sinus pressure since Friday.  Respiratory panel was ordered and was negative for COVID, flu, RSV.  Lung exam was normal with temp 98.4, respirations 16, pulse 86, and SpO2 100% on room air; I did not feel a chest x-ray was needed at this time.  I am going to do symptomatic treatment for her cough with refill of her albuterol  inhaler, also going to provide Flonase and cough Perles she can take symptomatically. Patient was given the opportunity to ask questions, all questions were  answered.  Emergency department return precautions were discussed with the patient.  Patient is in agreement to the treatment plan.  Patient is stable for discharge.    FINAL CLINICAL IMPRESSION(S) / ED DIAGNOSES   Final diagnoses:  Acute cough     Rx / DC Orders   ED Discharge Orders          Ordered    albuterol  (VENTOLIN  HFA) 108 (90 Base) MCG/ACT inhaler  Every 6 hours PRN        01/19/24 2037    benzonatate  (TESSALON  PERLES) 100 MG capsule  2 times daily PRN        01/19/24 2037    fluticasone (FLONASE) 50 MCG/ACT nasal spray  As needed        01/19/24 2037             Note:  This document was prepared using Dragon voice recognition software and may include unintentional dictation errors.    Thomasenia Flesher, PA-C 01/19/24 0932    Claria Crofts, MD 01/19/24 [phone]

## 2024-01-23 ENCOUNTER — Other Ambulatory Visit: Payer: Self-pay

## 2024-01-30 ENCOUNTER — Other Ambulatory Visit: Payer: Self-pay

## 2024-02-25 ENCOUNTER — Emergency Department
Admission: EM | Admit: 2024-02-25 | Discharge: 2024-02-25 | Disposition: A | Discharge status: Home / Self Care | Attending: Emergency Medicine | Admitting: Emergency Medicine

## 2024-02-25 DIAGNOSIS — R197 Diarrhea, unspecified: Secondary | ICD-10-CM | POA: Diagnosis not present

## 2024-02-25 DIAGNOSIS — R112 Nausea with vomiting, unspecified: Secondary | ICD-10-CM | POA: Insufficient documentation

## 2024-02-25 LAB — CBC
HCT: 41 % (ref 36.0–46.0)
Hemoglobin: 13.3 g/dL (ref 12.0–15.0)
MCH: 29.2 pg (ref 26.0–34.0)
MCHC: 32.4 g/dL (ref 30.0–36.0)
MCV: 89.9 fL (ref 80.0–100.0)
Platelets: 265 K/uL (ref 150–400)
RBC: 4.56 MIL/uL (ref 3.87–5.11)
RDW: 13.5 % (ref 11.5–15.5)
WBC: 9.1 K/uL (ref 4.0–10.5)
nRBC: 0 % (ref 0.0–0.2)

## 2024-02-25 LAB — URINALYSIS, ROUTINE W REFLEX MICROSCOPIC
Bilirubin Urine: NEGATIVE
Glucose, UA: NEGATIVE mg/dL
Hgb urine dipstick: NEGATIVE
Ketones, ur: NEGATIVE mg/dL
Leukocytes,Ua: NEGATIVE
Nitrite: NEGATIVE
Protein, ur: NEGATIVE mg/dL
Specific Gravity, Urine: 1.019 (ref 1.005–1.030)
pH: 5 (ref 5.0–8.0)

## 2024-02-25 LAB — COMPREHENSIVE METABOLIC PANEL WITH GFR
ALT: 28 U/L (ref 0–44)
AST: 23 U/L (ref 15–41)
Albumin: 3.5 g/dL (ref 3.5–5.0)
Alkaline Phosphatase: 73 U/L (ref 38–126)
Anion gap: 6 (ref 5–15)
BUN: 10 mg/dL (ref 6–20)
CO2: 22 mmol/L (ref 22–32)
Calcium: 8.7 mg/dL — ABNORMAL LOW (ref 8.9–10.3)
Chloride: 108 mmol/L (ref 98–111)
Creatinine, Ser: 0.49 mg/dL (ref 0.44–1.00)
GFR, Estimated: >60 mL/min (ref >60)
Glucose, Bld: 116 mg/dL — ABNORMAL HIGH (ref 70–99)
Potassium: 3.8 mmol/L (ref 3.5–5.1)
Sodium: 136 mmol/L (ref 135–145)
Total Bilirubin: 0.5 mg/dL (ref 0.0–1.2)
Total Protein: 6.9 g/dL (ref 6.5–8.1)

## 2024-02-25 LAB — POC URINE PREG, ED: Preg Test, Ur: NEGATIVE

## 2024-02-25 LAB — LIPASE, BLOOD: Lipase: 26 U/L (ref 11–51)

## 2024-02-25 MED ORDER — ONDANSETRON 8 MG PO TBDP
8.0000 mg | ORAL_TABLET | Freq: Once | ORAL | Status: AC
  Administered 2024-02-25: 8 mg via ORAL
  Filled 2024-02-25: qty 1

## 2024-02-25 MED ORDER — PANTOPRAZOLE SODIUM 40 MG PO TBEC
40.0000 mg | DELAYED_RELEASE_TABLET | ORAL | 1 refills | Status: DC
  Filled 2024-02-25: qty 30, 30d supply, fill #0

## 2024-02-25 MED ORDER — ONDANSETRON 4 MG PO TBDP
4.0000 mg | ORAL_TABLET | Freq: Three times a day (TID) | ORAL | 0 refills | Status: DC | PRN
  Filled 2024-02-25: qty 20, 7d supply, fill #0

## 2024-02-25 MED ORDER — LIDOCAINE VISCOUS HCL 2 % MT SOLN
15.0000 mL | Freq: Once | OROMUCOSAL | Status: AC
  Administered 2024-02-25: 15 mL via ORAL
  Filled 2024-02-25: qty 15

## 2024-02-25 MED ORDER — PANTOPRAZOLE SODIUM 40 MG PO TBEC
40.0000 mg | DELAYED_RELEASE_TABLET | Freq: Once | ORAL | Status: AC
  Administered 2024-02-25: 40 mg via ORAL
  Filled 2024-02-25: qty 1

## 2024-02-25 MED ORDER — ALUM & MAG HYDROXIDE-SIMETH 200-200-20 MG/5ML PO SUSP
30.0000 mL | Freq: Once | ORAL | Status: AC
  Administered 2024-02-25: 30 mL via ORAL
  Filled 2024-02-25: qty 30

## 2024-02-25 NOTE — ED Triage Notes (Signed)
 Pt to ED via POV from home. Pt reports started to feel ill last pm. Pt reports today started to have N/V/D and abd pain.

## 2024-02-25 NOTE — ED Provider Notes (Signed)
 [facility] Emergency Department Provider Note     Event Date/Time   First MD Initiated Contact with Patient 02/25/24 1845     (approximate)   History   N/V/D   HPI  Jaime Hernandez is a 28 y.o. female with a history of PCOS presents to the the ED with complaint of feeling ill with onset of last night and then woke up this morning with nausea, 1 episode of vomiting, and 1 episode of diarrhea.  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  She also reports she believes she ate could have ate something that was not very good. She denies any other sick contacts, fever, blood in stool or abdominal pain.    Physical Exam   Triage Vital Signs: ED Triage Vitals  Encounter Vitals Group     BP 02/25/24 1602 138/79     Girls Systolic BP Percentile --      Girls Diastolic BP Percentile --      Boys Systolic BP Percentile --      Boys Diastolic BP Percentile --      Pulse Rate 02/25/24 1602 88     Resp 02/25/24 1602 18     Temp 02/25/24 1602 98.3 F (36.8 C)     Temp Source 02/25/24 1602 Oral     SpO2 02/25/24 1602 100 %     Weight --      Height --      Head Circumference --      Peak Flow --      Pain Score 02/25/24 1853 0     Pain Loc --      Pain Education --      Exclude from Growth Chart --     Most recent vital signs: Vitals:   02/25/24 1602  BP: 138/79  Pulse: 88  Resp: 18  Temp: 98.3 F (36.8 C)  SpO2: 100%    General Awake, no distress.  CV:  Good peripheral perfusion.  RRR RESP:  Normal effort.  LCTAB ABD:  No distention.  Soft, nontender to palpation.  ED Results / Procedures / Treatments   Labs (all labs ordered are listed, but only abnormal results are displayed) Labs Reviewed  COMPREHENSIVE METABOLIC PANEL WITH GFR - Abnormal; Notable for the following components:      Result Value   Glucose, Bld 116 (*)    Calcium 8.7 (*)    All  other components within normal limits  URINALYSIS, ROUTINE W REFLEX MICROSCOPIC - Abnormal; Notable for the following components:   Color, Urine YELLOW (*)    APPearance CLEAR (*)    All other components within normal limits  LIPASE, BLOOD  CBC  POC URINE PREG, ED   No results found.  PROCEDURES:  Critical Care performed: No  Procedures   MEDICATIONS ORDERED IN ED: Medications  alum & mag hydroxide-simeth (MAALOX/MYLANTA) 200-200-20 MG/5ML suspension 30 mL (has no administration in time range)    And  lidocaine  (XYLOCAINE ) 2 % viscous mouth solution 15 mL (has no administration in time range)  ondansetron  (ZOFRAN -ODT) disintegrating tablet 8 mg (has no administration in time range)  pantoprazole  (PROTONIX ) EC tablet 40 mg (has no administration in time range)     IMPRESSION / MDM / ASSESSMENT AND PLAN / ED COURSE  I reviewed the triage vital signs and the nursing notes.  28 y.o. female presents to the emergency department for evaluation and treatment of acute nausea, vomiting diarrhea. See HPI for further details.   Differential diagnosis includes, but is not limited to electrolyte abnormality, gastritis, viral gastroenteritis, pregnancy  Patient's presentation is most consistent with acute complicated illness / injury requiring diagnostic workup.  Patient is alert and oriented.  She is hemodynamically stable and afebrile.  Physical exam findings are as stated above.  Normal abdominal exam.  Lab work is reassuring.  Urinalysis is normal.  Pregnancy test is negative.  Will administer GI cocktail.  No indication for imaging at this time.  Patient stable condition for discharge home at this time and outpatient management.  ED return precautions discussed.  Patient verbalized understanding is agreement with this care plan.  FINAL CLINICAL IMPRESSION(S) / ED DIAGNOSES   Final diagnoses:  Nausea vomiting and diarrhea   Rx / DC Orders   ED  Discharge Orders          Ordered    pantoprazole  (PROTONIX ) 40 MG tablet  BH-each morning        02/25/24 2028    ondansetron  (ZOFRAN -ODT) 4 MG disintegrating tablet  Every 8 hours PRN        02/25/24 2028           Note:  This document was prepared using Dragon voice recognition software and may include unintentional dictation errors.    [name], Shaniya Tashiro A, PA-C 02/25/24 2035    Jacolyn Pae, MD 02/25/24 2210

## 2024-02-25 NOTE — ED Provider Triage Note (Signed)
 Emergency Medicine Provider Triage Evaluation Note  Jaime Hernandez , a 28 y.o. female  was evaluated in triage.  Pt complains of feeling unwell last night and then woke up with vomiting and diarrhea. 2 episodes of diarrhea and 1 episode of vomiting. 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   Review of Systems  Positive:  Negative:   Physical Exam  There were no vitals taken for this visit. Gen:   Awake, no distress   Resp:  Normal effort  MSK:   Moves extremities without difficulty  Other:    Medical Decision Making  Medically screening exam initiated at 4:00 PM.  Appropriate orders placed.  Jaime Hernandez was informed that the remainder of the evaluation will be completed by another provider, this initial triage assessment does not replace that evaluation, and the importance of remaining in the ED until their evaluation is complete.    Jaime Hernandez, Jaime Hernandez A, PA-C 02/25/24 [phone]

## 2024-02-25 NOTE — Discharge Instructions (Signed)
 Your evaluated in the ED nausea, vomiting and diarrhea.  Your lab work is reassuring.  Your urinalysis is normal.  Your physical exam findings reassuring.  Get plenty of rest and stay hydrated.  Please follow-up with your primary care provider.

## 2024-02-26 ENCOUNTER — Other Ambulatory Visit: Payer: Self-pay

## 2024-02-27 DIAGNOSIS — R102 Pelvic and perineal pain: Secondary | ICD-10-CM | POA: Diagnosis not present

## 2024-03-09 ENCOUNTER — Other Ambulatory Visit: Payer: Self-pay

## 2024-03-10 ENCOUNTER — Emergency Department
Admission: EM | Admit: 2024-03-10 | Discharge: 2024-03-10 | Disposition: A | Discharge status: Home / Self Care | Attending: Emergency Medicine | Admitting: Emergency Medicine

## 2024-03-10 DIAGNOSIS — Z8742 Personal history of other diseases of the female genital tract: Secondary | ICD-10-CM | POA: Diagnosis not present

## 2024-03-10 DIAGNOSIS — Z0289 Encounter for other administrative examinations: Secondary | ICD-10-CM | POA: Insufficient documentation

## 2024-03-10 DIAGNOSIS — L720 Epidermal cyst: Secondary | ICD-10-CM | POA: Diagnosis not present

## 2024-03-10 NOTE — ED Provider Notes (Signed)
 [facility] Emergency Department Provider Note     Event Date/Time   First MD Initiated Contact with Patient 03/10/24 1807     (approximate)   History   Medical Clearance   HPI  Jaime Hernandez is a 28 y.o. female with a history of PCOS presents to the ED requesting FMLA paperwork for her symptoms of PCOS.  Patient reports she went to her OB/GYN who could not fulfill this request due to needing documented evidence to support the link between her symptoms and cyst for FMLA purposes and there is no recent imaging confirming a cyst at the time of her symptoms.  Patient is concerned that she will lose her job if [facility] paperwork is not filled out.  Patient is asymptomatic at the time of this ED visit.      Physical Exam   Triage Vital Signs: ED Triage Vitals  Encounter Vitals Group     BP 03/10/24 1527 (!) 127/91     Girls Systolic BP Percentile --      Girls Diastolic BP Percentile --      Boys Systolic BP Percentile --      Boys Diastolic BP Percentile --      Pulse Rate 03/10/24 1527 90     Resp 03/10/24 1527 18     Temp 03/10/24 1527 98.5 F (36.9 C)     Temp Source 03/10/24 1527 Oral     SpO2 03/10/24 1527 97 %     Weight --      Height --      Head Circumference --      Peak Flow --      Pain Score 03/10/24 1525 3     Pain Loc --      Pain Education --      Exclude from Growth Chart --     Most recent vital signs: Vitals:   03/10/24 1527  BP: (!) 127/91  Pulse: 90  Resp: 18  Temp: 98.5 F (36.9 C)  SpO2: 97%    General Awake, no distress. HEENT NCAT.  CV:  Good peripheral perfusion.  RESP:  Normal effort.  ABD:  No distention.   ED Results / Procedures / Treatments   Labs (all labs ordered are listed, but only abnormal results are displayed) Labs Reviewed - No data to display  No results found.  PROCEDURES:  Critical Care performed: No  Procedures  MEDICATIONS ORDERED IN ED: Medications - No data to  display  IMPRESSION / MDM / ASSESSMENT AND PLAN / ED COURSE  I reviewed the triage vital signs and the nursing notes.                               28 y.o. female presents to the emergency department for evaluation and treatment of request for [facility] documentation for PCOS. See HPI for further details.   Patient's presentation is most consistent with acute, uncomplicated illness.  Patient is alert and oriented.  She is hemodynamically stable.  Patient asymptomatic.  Discussed with patient that we are unable to fulfill her request as the ED cannot provide assistance with FMLA forms.  Advised to follow-up with primary care or routine provider to fill out this form.  Referral for PCP ordered.  Patient is also provided with a list of local primary care offices accepting new patients at this time.  Advised possible follow-up with health department to fulfill her  request.  Patient is otherwise in stable condition for discharge home and outpatient management.  FINAL CLINICAL IMPRESSION(S) / ED DIAGNOSES   Final diagnoses:  History of PCOS   Rx / DC Orders   ED Discharge Orders          Ordered    Ambulatory Referral to Primary Care (Establish Care)        03/10/24 1831             Note:  This document was prepared using Dragon voice recognition software and may include unintentional dictation errors.    [name], Kristle Wesch A, PA-C 03/10/24 ALVIE    Claudene Rover, MD 03/10/24 [phone]

## 2024-03-10 NOTE — ED Notes (Signed)
 Pt states she was told she needs imaging to prove again that she has PCOS

## 2024-03-10 NOTE — Discharge Instructions (Addendum)
 Unfortunately the ED cannot provide assistance with FMLA forms. You will need a primary care or routine provider to fill this form.  I will place a referral for PCP and there is a list provided for you below of local primary care offices accepting new patients at this time.  You can also consider the health department and assisting you with your request.  Please go to the following website to schedule new (and existing) patient appointments:   [email or web address]   The following is a list of primary care offices in the area who are accepting new patients at this time.  Please reach out to one of them directly and let them know you would like to schedule an appointment to follow up on an Emergency Department visit, and/or to establish a new primary care provider (PCP).  There are likely other primary care clinics in the are who are accepting new patients, but this is an excellent place to start:  [facility] Lead physician: Dr Jon Eva [address] [place], [place] [address] [phone]  [facility] Lead Physician: Dr Dorette Loron [address], [place], [place] [address] [phone]  [facility]  Lead Physician: Dr Duwaine Louder [address] [place], [place] [address] [phone]  [facility] Lead Physician: Dr Marolyn Officer [address] [place], [place] [address] [phone]  [facility] at [facility] Lead Physician: Dr Leita Adie [address] [place], [place], [place] [address] [phone]

## 2024-03-10 NOTE — ED Triage Notes (Signed)
 Pt was out of work for PCOS. Unable to get OB paperwork until Oct. to prove to work that she needed to be out for PCOS.

## 2024-04-10 ENCOUNTER — Emergency Department
Admission: EM | Admit: 2024-04-10 | Discharge: 2024-04-10 | Disposition: A | Discharge status: Home / Self Care | Attending: Emergency Medicine | Admitting: Emergency Medicine

## 2024-04-10 ENCOUNTER — Other Ambulatory Visit: Payer: Self-pay

## 2024-04-10 DIAGNOSIS — E282 Polycystic ovarian syndrome: Secondary | ICD-10-CM | POA: Insufficient documentation

## 2024-04-10 DIAGNOSIS — R102 Pelvic and perineal pain: Secondary | ICD-10-CM | POA: Diagnosis not present

## 2024-04-10 LAB — WET PREP, GENITAL
Clue Cells Wet Prep HPF POC: NONE SEEN
Sperm: NONE SEEN
Trich, Wet Prep: NONE SEEN
WBC, Wet Prep HPF POC: <10 (ref <10)
Yeast Wet Prep HPF POC: NONE SEEN

## 2024-04-10 LAB — URINALYSIS, ROUTINE W REFLEX MICROSCOPIC
Bilirubin Urine: NEGATIVE
Glucose, UA: NEGATIVE mg/dL
Hgb urine dipstick: NEGATIVE
Ketones, ur: NEGATIVE mg/dL
Leukocytes,Ua: NEGATIVE
Nitrite: NEGATIVE
Protein, ur: NEGATIVE mg/dL
Specific Gravity, Urine: 1.028 (ref 1.005–1.030)
pH: 6 (ref 5.0–8.0)

## 2024-04-10 LAB — COMPREHENSIVE METABOLIC PANEL WITH GFR
ALT: 43 U/L (ref 0–44)
AST: 26 U/L (ref 15–41)
Albumin: 3.8 g/dL (ref 3.5–5.0)
Alkaline Phosphatase: 82 U/L (ref 38–126)
Anion gap: 7 (ref 5–15)
BUN: 13 mg/dL (ref 6–20)
CO2: 25 mmol/L (ref 22–32)
Calcium: 9 mg/dL (ref 8.9–10.3)
Chloride: 106 mmol/L (ref 98–111)
Creatinine, Ser: 0.73 mg/dL (ref 0.44–1.00)
GFR, Estimated: >60 mL/min (ref >60)
Glucose, Bld: 139 mg/dL — ABNORMAL HIGH (ref 70–99)
Potassium: 3.7 mmol/L (ref 3.5–5.1)
Sodium: 138 mmol/L (ref 135–145)
Total Bilirubin: 0.7 mg/dL (ref 0.0–1.2)
Total Protein: 7.2 g/dL (ref 6.5–8.1)

## 2024-04-10 LAB — CBC WITH DIFFERENTIAL/PLATELET
Abs Immature Granulocytes: 0.04 K/uL (ref 0.00–0.07)
Basophils Absolute: 0 K/uL (ref 0.0–0.1)
Basophils Relative: 0 %
Eosinophils Absolute: 0.1 K/uL (ref 0.0–0.5)
Eosinophils Relative: 1 %
HCT: 42 % (ref 36.0–46.0)
Hemoglobin: 13.8 g/dL (ref 12.0–15.0)
Immature Granulocytes: 1 %
Lymphocytes Relative: 31 %
Lymphs Abs: 2.4 K/uL (ref 0.7–4.0)
MCH: 29.1 pg (ref 26.0–34.0)
MCHC: 32.9 g/dL (ref 30.0–36.0)
MCV: 88.4 fL (ref 80.0–100.0)
Monocytes Absolute: 0.4 K/uL (ref 0.1–1.0)
Monocytes Relative: 4 %
Neutro Abs: 5 K/uL (ref 1.7–7.7)
Neutrophils Relative %: 63 %
Platelets: 308 K/uL (ref 150–400)
RBC: 4.75 MIL/uL (ref 3.87–5.11)
RDW: 13.5 % (ref 11.5–15.5)
WBC: 7.9 K/uL (ref 4.0–10.5)
nRBC: 0 % (ref 0.0–0.2)

## 2024-04-10 LAB — HCG, QUANTITATIVE, PREGNANCY: hCG, Beta Chain, Quant, S: <1 m[IU]/mL (ref <5)

## 2024-04-10 MED ORDER — ONDANSETRON 4 MG PO TBDP
4.0000 mg | ORAL_TABLET | Freq: Three times a day (TID) | ORAL | 0 refills | Status: DC | PRN
  Filled 2024-04-10: qty 15, 5d supply, fill #0

## 2024-04-10 NOTE — ED Provider Triage Note (Signed)
 Emergency Medicine Provider Triage Evaluation Note  Jaime Hernandez , a 28 y.o. female  was evaluated in triage.  Pt complains of diarrhea.  Patient presents with symptoms of diarrhea, vomiting, no fever.  Last menstrual period February 10, 2010 of 2025.  Patient reports lower abdominal pain.  Patient works here  Review of Systems  Positive:  Negative:   Physical Exam  BP (!) 121/99   Pulse 92   Temp 98.1 F (36.7 C) (Oral)   Resp 18   LMP 02/14/2024 (Exact Date)   SpO2 98% in triage patient was hypertensive afebrile Gen:   Awake, no distress   Resp:  Normal effort  MSK:   Moves extremities without difficulty  Other:    Medical Decision Making  Medically screening exam initiated at 4:59 PM.  Appropriate orders placed.  Jaime Hernandez was informed that the remainder of the evaluation will be completed by another provider, this initial triage assessment does not replace that evaluation, and the importance of remaining in the ED until their evaluation is complete.  Patient who presents today with history of vomiting, diarrhea, last menstrual period February 14, 2024.  Patient reports having lower abdominal pain.  Patient works here.  Ordered CBC, CMP, UA, pregnancy test   Jaime Kast, PA-C 04/10/24 1701

## 2024-04-10 NOTE — ED Triage Notes (Signed)
 Pt complains of abd pain, diarrhea, vomiting and states ovaries feel heavy. Possible pregnancy. Last cycle in July

## 2024-04-10 NOTE — ED Notes (Signed)
 PT in no acute distress prior to discharge. Discharged instructions reviewed and pt stated that they understand directions. Pt has all belongings with them at time of discharge.

## 2024-04-10 NOTE — ED Notes (Signed)
Second urine sent to the lab

## 2024-04-10 NOTE — Discharge Instructions (Addendum)
 Your exam and labs are normal and reassuring. Take the prescription meds as directed.

## 2024-04-10 NOTE — ED Provider Notes (Signed)
 [facility] Emergency Department Provider Note     Event Date/Time   First MD Initiated Contact with Patient 04/10/24 1737     (approximate)   History   Abdominal Pain   HPI  Jaime Hernandez is a 28 y.o. female with a history of PCOS, obesity, and desire pregnancy, presents to the ED endorsing some abdominal pain with some associated nausea vomiting, diarrhea.  She reports generally that her ovaries feel heavy which is typical for her PCOS symptoms.  She is is not endorsing any current birth control, but instead is attempting weight loss and desires pregnancy with her husband.  She reports her last menstrual cycle was in July.  She is being followed by [facility] for her GYN care.  She wonders if her diarrhea symptoms are related to a viral infection.  She denies any fevers, chills, sweats, chest pain, cough.  She typically manages her PCOS pain with OTC Tylenol  or Motrin .  Physical Exam   Triage Vital Signs: ED Triage Vitals  Encounter Vitals Group     BP 04/10/24 1654 (!) 121/99     Girls Systolic BP Percentile --      Girls Diastolic BP Percentile --      Boys Systolic BP Percentile --      Boys Diastolic BP Percentile --      Pulse Rate 04/10/24 1654 92     Resp 04/10/24 1654 18     Temp 04/10/24 1654 98.1 F (36.7 C)     Temp Source 04/10/24 1654 Oral     SpO2 04/10/24 1654 98 %     Weight --      Height --      Head Circumference --      Peak Flow --      Pain Score 04/10/24 1656 8     Pain Loc --      Pain Education --      Exclude from Growth Chart --     Most recent vital signs: Vitals:   04/10/24 1654 04/10/24 1735  BP: (!) 121/99   Pulse: 92   Resp: 18   Temp: 98.1 F (36.7 C)   SpO2: 98% 100%    General Awake, no distress. NAD HEENT NCAT. PERRL. EOMI. No rhinorrhea. Mucous membranes are moist.  CV:  Good peripheral perfusion. RRR RESP:  Normal effort. CTA ABD:  No distention.  Soft and nontender generally.  Nontender  to palpation to the lower pelvic region.  No rebound, guarding, or rigidity noted. GYN:  Deferred    ED Results / Procedures / Treatments   Labs (all labs ordered are listed, but only abnormal results are displayed) Labs Reviewed  COMPREHENSIVE METABOLIC PANEL WITH GFR - Abnormal; Notable for the following components:      Result Value   Glucose, Bld 139 (*)    All other components within normal limits  URINALYSIS, ROUTINE W REFLEX MICROSCOPIC - Abnormal; Notable for the following components:   Color, Urine YELLOW (*)    APPearance HAZY (*)    All other components within normal limits  WET PREP, GENITAL  CBC WITH DIFFERENTIAL/PLATELET  HCG, QUANTITATIVE, PREGNANCY  POC URINE PREG, ED   EKG   RADIOLOGY  No results found.   PROCEDURES:  Critical Care performed: No  Procedures   MEDICATIONS ORDERED IN ED: Medications - No data to display   IMPRESSION / MDM / ASSESSMENT AND PLAN / ED COURSE  I reviewed the triage vital  signs and the nursing notes.                              Differential diagnosis includes, but is not limited to, ovarian cyst, ovarian torsion, acute appendicitis, diverticulitis, urinary tract infection/pyelonephritis, endometriosis, bowel obstruction, colitis, renal colic, gastroenteritis, hernia, fibroids, pregnancy related pain including ectopic pregnancy, etc.  Patient's presentation is most consistent with acute complicated illness / injury requiring diagnostic workup.  Patient's diagnosis is consistent with pelvic pain likely due to PCOS, and intermittent bowel changes likely representing a viral gastroenteritis.  Patient presents in no acute distress.  She is afebrile on presentation without intractable nausea, vomiting, or abdominal pain.  Labs overall reassuring with no acute lab abnormalities noted.  No UA evidence of bacteriuria and urine pregnancy test is negative at this time.  We discussed the option of pelvic ultrasound, but patient  declined at this time, noting no significant change in overall symptoms.  Patient will be discharged home with prescriptions for Zofran  and instructions to continue to take OTC Tylenol  or Motrin  as needed. Patient is to follow up with her GYN provider for ongoing evaluation and management as discussed, as needed or otherwise directed. Patient is given ED precautions to return to the ED for any worsening or new symptoms.   FINAL CLINICAL IMPRESSION(S) / ED DIAGNOSES   Final diagnoses:  Pelvic pain in female  PCOS (polycystic ovarian syndrome)     Rx / DC Orders   ED Discharge Orders          Ordered    ondansetron  (ZOFRAN -ODT) 4 MG disintegrating tablet  Every 8 hours PRN        04/10/24 1932             Note:  This document was prepared using Dragon voice recognition software and may include unintentional dictation errors.    Loyd Candida LULLA Aldona, PA-C 04/10/24 1943    Willo Dunnings, MD 04/11/24 2321

## 2024-04-11 ENCOUNTER — Other Ambulatory Visit: Payer: Self-pay

## 2024-04-21 ENCOUNTER — Other Ambulatory Visit: Payer: Self-pay

## 2024-06-02 ENCOUNTER — Other Ambulatory Visit: Payer: Self-pay

## 2024-06-02 MED ORDER — METFORMIN HCL ER 500 MG PO TB24
ORAL_TABLET | ORAL | 11 refills | Status: DC
  Filled 2024-06-02: qty 30, 17d supply, fill #0
  Filled 2024-06-29: qty 30, 9d supply, fill #1

## 2024-06-09 ENCOUNTER — Other Ambulatory Visit: Payer: Self-pay

## 2024-06-29 ENCOUNTER — Other Ambulatory Visit: Payer: Self-pay

## 2024-07-07 ENCOUNTER — Other Ambulatory Visit: Payer: Self-pay

## 2024-07-07 MED ORDER — METFORMIN HCL ER 500 MG PO TB24
1000.0000 mg | ORAL_TABLET | Freq: Two times a day (BID) | ORAL | 1 refills | Status: DC
  Filled 2024-07-07: qty 120, 30d supply, fill #0

## 2024-07-10 ENCOUNTER — Emergency Department

## 2024-07-10 ENCOUNTER — Other Ambulatory Visit: Payer: Self-pay

## 2024-07-10 ENCOUNTER — Emergency Department
Admission: EM | Admit: 2024-07-10 | Discharge: 2024-07-10 | Disposition: A | Discharge status: Home / Self Care | Attending: Emergency Medicine | Admitting: Emergency Medicine

## 2024-07-10 DIAGNOSIS — K429 Umbilical hernia without obstruction or gangrene: Secondary | ICD-10-CM | POA: Diagnosis not present

## 2024-07-10 DIAGNOSIS — R1013 Epigastric pain: Secondary | ICD-10-CM

## 2024-07-10 DIAGNOSIS — N200 Calculus of kidney: Secondary | ICD-10-CM | POA: Insufficient documentation

## 2024-07-10 DIAGNOSIS — Z7984 Long term (current) use of oral hypoglycemic drugs: Secondary | ICD-10-CM | POA: Insufficient documentation

## 2024-07-10 DIAGNOSIS — K820 Obstruction of gallbladder: Secondary | ICD-10-CM | POA: Diagnosis not present

## 2024-07-10 DIAGNOSIS — K760 Fatty (change of) liver, not elsewhere classified: Secondary | ICD-10-CM | POA: Diagnosis not present

## 2024-07-10 DIAGNOSIS — R109 Unspecified abdominal pain: Secondary | ICD-10-CM | POA: Diagnosis not present

## 2024-07-10 LAB — URINALYSIS, ROUTINE W REFLEX MICROSCOPIC
Bilirubin Urine: NEGATIVE
Glucose, UA: NEGATIVE mg/dL
Hgb urine dipstick: NEGATIVE
Ketones, ur: NEGATIVE mg/dL
Leukocytes,Ua: NEGATIVE
Nitrite: NEGATIVE
Protein, ur: NEGATIVE mg/dL
Specific Gravity, Urine: 1.02 (ref 1.005–1.030)
pH: 6 (ref 5.0–8.0)

## 2024-07-10 LAB — COMPREHENSIVE METABOLIC PANEL WITH GFR
ALT: 22 U/L (ref 0–44)
AST: 17 U/L (ref 15–41)
Albumin: 3 g/dL — ABNORMAL LOW (ref 3.5–5.0)
Alkaline Phosphatase: 89 U/L (ref 38–126)
Anion gap: 11 (ref 5–15)
BUN: 11 mg/dL (ref 6–20)
CO2: 25 mmol/L (ref 22–32)
Calcium: 9.3 mg/dL (ref 8.9–10.3)
Chloride: 101 mmol/L (ref 98–111)
Creatinine, Ser: 0.63 mg/dL (ref 0.44–1.00)
GFR, Estimated: >60 mL/min (ref >60)
Glucose, Bld: 107 mg/dL — ABNORMAL HIGH (ref 70–99)
Potassium: 4.1 mmol/L (ref 3.5–5.1)
Sodium: 137 mmol/L (ref 135–145)
Total Bilirubin: 0.2 mg/dL (ref 0.0–1.2)
Total Protein: 7.1 g/dL (ref 6.5–8.1)

## 2024-07-10 LAB — CBC
HCT: 41.5 % (ref 36.0–46.0)
Hemoglobin: 13.4 g/dL (ref 12.0–15.0)
MCH: 28.7 pg (ref 26.0–34.0)
MCHC: 32.3 g/dL (ref 30.0–36.0)
MCV: 88.9 fL (ref 80.0–100.0)
Platelets: 311 K/uL (ref 150–400)
RBC: 4.67 MIL/uL (ref 3.87–5.11)
RDW: 13.7 % (ref 11.5–15.5)
WBC: 11.4 K/uL — ABNORMAL HIGH (ref 4.0–10.5)
nRBC: 0 % (ref 0.0–0.2)

## 2024-07-10 LAB — POC URINE PREG, ED: Preg Test, Ur: NEGATIVE

## 2024-07-10 LAB — LIPASE, BLOOD: Lipase: 20 U/L (ref 11–51)

## 2024-07-10 MED ORDER — IOHEXOL 350 MG/ML SOLN
100.0000 mL | Freq: Once | INTRAVENOUS | Status: AC | PRN
  Administered 2024-07-10: 100 mL via INTRAVENOUS

## 2024-07-10 MED ORDER — DICYCLOMINE HCL 10 MG PO CAPS
10.0000 mg | ORAL_CAPSULE | Freq: Three times a day (TID) | ORAL | 0 refills | Status: DC
  Filled 2024-07-10: qty 15, 4d supply, fill #0

## 2024-07-10 MED ORDER — DICYCLOMINE HCL 20 MG PO TABS
20.0000 mg | ORAL_TABLET | Freq: Once | ORAL | Status: AC
  Administered 2024-07-10: 20 mg via ORAL
  Filled 2024-07-10: qty 1

## 2024-07-10 NOTE — ED Triage Notes (Addendum)
 Pt presents for epigastric abdominal pain with nausea. Denies radiation of pain or emesis. Denies known fevers. Hx PCOS but states this does not feel like her typical pain. LBM today- softer than normal. No blood.   Past Medical History:  Diagnosis Date   Obesity    PCOS (polycystic ovarian syndrome)    PCOS (polycystic ovarian syndrome)

## 2024-07-10 NOTE — ED Provider Notes (Signed)
 [facility] Provider Note    Event Date/Time   First MD Initiated Contact with Patient 07/10/24 2019     (approximate)   History   Abdominal Pain   HPI  Jaime Hernandez is a 28 y.o. female with history of PCOS and as listed in EMR presents to the emergency department for treatment and evaluation of epigastric pain with nausea.  No fever.  She has been on metformin  for about a month but has not had any issues.  Pain is different than her typical discomfort due to ovarian cysts.     Physical Exam    Vitals:   07/10/24 1941 07/10/24 1943  BP:  131/82  Pulse: 73   Resp: 16   Temp: 98.1 F (36.7 C)   SpO2: 100%     General: Awake, no distress.  CV:  Good peripheral perfusion.  Resp:  Normal effort.  Abd:  No distention.  Soft.  Epigastric/mid abdomen tender to palpation Other:     ED Results / Procedures / Treatments   Labs (all labs ordered are listed, but only abnormal results are displayed)  Labs Reviewed  COMPREHENSIVE METABOLIC PANEL WITH GFR - Abnormal; Notable for the following components:      Result Value   Glucose, Bld 107 (*)    Albumin 3.0 (*)    All other components within normal limits  CBC - Abnormal; Notable for the following components:   WBC 11.4 (*)    All other components within normal limits  URINALYSIS, ROUTINE W REFLEX MICROSCOPIC - Abnormal; Notable for the following components:   Color, Urine YELLOW (*)    APPearance CLEAR (*)    All other components within normal limits  LIPASE, BLOOD  POC URINE PREG, ED     EKG  Not indicated   RADIOLOGY  Image and radiology report reviewed and interpreted by me. Radiology report consistent with the same.  Ultrasound of the right upper quadrant negative for acute concerns.  CT of the abdomen and pelvis with contrast shows no acute concerns.  There is a 4 mm nonobstructing calculus in the upper pole of the left kidney  PROCEDURES:  Critical Care performed:  No  Procedures   MEDICATIONS ORDERED IN ED:  Medications  dicyclomine  (BENTYL ) tablet 20 mg (has no administration in time range)  iohexol  (OMNIPAQUE ) 350 MG/ML injection 100 mL (100 mLs Intravenous Contrast Given 07/10/24 2202)     IMPRESSION / MDM / ASSESSMENT AND PLAN / ED COURSE   I have reviewed the triage note and vital signs. Vital signs are stable   Differential diagnosis includes, but is not limited to, cholelithiasis, acute cholecystitis, appendicitis, ovarian cyst, ruptured ovarian cyst  Patient's presentation is most consistent with acute presentation with potential threat to life or bodily function.  28 year old female presenting to the emergency department for treatment and evaluation of abdominal pain.  See HPI for further details.  On exam, she does have some epigastric tenderness.  She denies previous abdominal surgeries.  Plan will be to get a right upper quadrant ultrasound to rule out cholelithiasis/acute cholecystitis.  Patient agreeable to the plan.  Clinical Course as of 07/10/24 2307  Fri Jul 10, 2024  2147 US  negative for acute concerns. Results discussed with patient. CT ordered. Patient declined pain medication at this time. [CT]    Clinical Course User Index [CT] Coleen Cardiff B, FNP   CT negative for acute concerns.  Results discussed with the patient.  Symptoms may be  side effect of metformin .  She was advised to discuss this with her OB/GYN.  Bentyl  given while here tonight and prescribed as well.  ER return precautions discussed.  FINAL CLINICAL IMPRESSION(S) / ED DIAGNOSES   Final diagnoses:  Epigastric pain     Rx / DC Orders   ED Discharge Orders          Ordered    dicyclomine  (BENTYL ) 10 MG capsule  3 times daily before meals & bedtime        07/10/24 2236             Note:  This document was prepared using Dragon voice recognition software and may include unintentional dictation errors.   Herlinda Kirk NOVAK, FNP 07/10/24  2307    Bradler, Evan K, MD 07/10/24 [phone]

## 2024-07-10 NOTE — Discharge Instructions (Signed)
 Please follow up with primary care.  Return to the ER for symptoms that change, worsen, or for new concerns if you are unable to schedule an appointment.

## 2024-07-10 NOTE — ED Notes (Signed)
 Pt states unable to provide a urine specimen at the current time.

## 2024-07-10 NOTE — ED Notes (Signed)
 To CT

## 2024-07-10 NOTE — ED Notes (Signed)
 Requested med from pharmacy

## 2024-07-11 ENCOUNTER — Other Ambulatory Visit: Payer: Self-pay

## 2024-07-23 ENCOUNTER — Other Ambulatory Visit: Payer: Self-pay

## 2024-07-23 ENCOUNTER — Emergency Department
Admission: EM | Admit: 2024-07-23 | Discharge: 2024-07-23 | Disposition: A | Discharge status: Home / Self Care | Source: Home / Self Care | Attending: Emergency Medicine | Admitting: Emergency Medicine

## 2024-07-23 ENCOUNTER — Emergency Department

## 2024-07-23 ENCOUNTER — Encounter: Payer: Self-pay | Admitting: Emergency Medicine

## 2024-07-23 DIAGNOSIS — D72829 Elevated white blood cell count, unspecified: Secondary | ICD-10-CM | POA: Diagnosis not present

## 2024-07-23 DIAGNOSIS — R0789 Other chest pain: Secondary | ICD-10-CM | POA: Diagnosis not present

## 2024-07-23 DIAGNOSIS — R079 Chest pain, unspecified: Secondary | ICD-10-CM | POA: Diagnosis not present

## 2024-07-23 LAB — CBC
HCT: 41.2 % (ref 36.0–46.0)
Hemoglobin: 13.6 g/dL (ref 12.0–15.0)
MCH: 28.8 pg (ref 26.0–34.0)
MCHC: 33 g/dL (ref 30.0–36.0)
MCV: 87.3 fL (ref 80.0–100.0)
Platelets: 323 K/uL (ref 150–400)
RBC: 4.72 MIL/uL (ref 3.87–5.11)
RDW: 13.5 % (ref 11.5–15.5)
WBC: 12.6 K/uL — ABNORMAL HIGH (ref 4.0–10.5)
nRBC: 0 % (ref 0.0–0.2)

## 2024-07-23 LAB — COMPREHENSIVE METABOLIC PANEL WITH GFR
ALT: 26 U/L (ref 0–44)
AST: 17 U/L (ref 15–41)
Albumin: 4.1 g/dL (ref 3.5–5.0)
Alkaline Phosphatase: 97 U/L (ref 38–126)
Anion gap: 9 (ref 5–15)
BUN: 13 mg/dL (ref 6–20)
CO2: 25 mmol/L (ref 22–32)
Calcium: 9.7 mg/dL (ref 8.9–10.3)
Chloride: 105 mmol/L (ref 98–111)
Creatinine, Ser: 0.55 mg/dL (ref 0.44–1.00)
GFR, Estimated: >60 mL/min (ref >60)
Glucose, Bld: 103 mg/dL — ABNORMAL HIGH (ref 70–99)
Potassium: 4.1 mmol/L (ref 3.5–5.1)
Sodium: 140 mmol/L (ref 135–145)
Total Bilirubin: <0.2 mg/dL (ref 0.0–1.2)
Total Protein: 7.1 g/dL (ref 6.5–8.1)

## 2024-07-23 LAB — TROPONIN T, HIGH SENSITIVITY: Troponin T High Sensitivity: <15 ng/L (ref 0–19)

## 2024-07-23 NOTE — ED Provider Notes (Signed)
 [facility] Provider Note    Event Date/Time   First MD Initiated Contact with Patient 07/23/24 [phone]     (approximate)   History   Chest Pain   HPI  Jaime Hernandez is a 28 y.o. female with a history of PCOS who presents with chest tightness.  The patient states that she woke up around 5 or 6 AM with a burning sensation in her left arm.  She has had this before.  However, this time it was associated with tightness in her chest.  This has now resolved.  She denies any actual chest pain.  She has no shortness of breath.  She denies any nausea or vomiting.  She does not feel dizzy or lightheaded.  She has no leg swelling.  She was recently started on metformin  and states that has been causing some GI upset and nausea.  I reviewed the past medical records.  The patient was most recently seen in the ED on 12/5 with epigastric pain and nausea.  She had a right upper quadrant ultrasound and CT abdomen at that time which were negative for acute findings.  Her most recent outpatient encounter was on 7/24 with OB/GYN for ovarian cysts.   Physical Exam   Triage Vital Signs: ED Triage Vitals  Encounter Vitals Group     BP 07/23/24 0632 124/83     Girls Systolic BP Percentile --      Girls Diastolic BP Percentile --      Boys Systolic BP Percentile --      Boys Diastolic BP Percentile --      Pulse Rate 07/23/24 0632 92     Resp 07/23/24 0632 20     Temp 07/23/24 0632 97.9 F (36.6 C)     Temp Source 07/23/24 0632 Oral     SpO2 07/23/24 0632 98 %     Weight 07/23/24 0625 264 lb 8.8 oz (120 kg)     Height 07/23/24 0625 5' 5 (1.651 m)     Head Circumference --      Peak Flow --      Pain Score 07/23/24 0624 5     Pain Loc --      Pain Education --      Exclude from Growth Chart --     Most recent vital signs: Vitals:   07/23/24 0632 07/23/24 0820  BP: 124/83 126/72  Pulse: 92 95  Resp: 20 19  Temp: 97.9 F (36.6 C) 98 F (36.7 C)  SpO2: 98% 98%      General: Alert, well-appearing, no distress.  CV:  Good peripheral perfusion.  Normal heart sounds. Resp:  Normal effort.  Lungs CTAB. Abd:  No distention.  Other:  No peripheral edema.   ED Results / Procedures / Treatments   Labs (all labs ordered are listed, but only abnormal results are displayed) Labs Reviewed  CBC - Abnormal; Notable for the following components:      Result Value   WBC 12.6 (*)    All other components within normal limits  COMPREHENSIVE METABOLIC PANEL WITH GFR - Abnormal; Notable for the following components:   Glucose, Bld 103 (*)    All other components within normal limits  TROPONIN T, HIGH SENSITIVITY     EKG  ED ECG REPORT I, Waylon Cassis, the attending physician, personally viewed and interpreted this ECG.  Date: 07/23/2024 EKG Time: 0631 Rate: 95 Rhythm: normal sinus rhythm QRS Axis: normal Intervals: normal ST/T Wave abnormalities:  Nonspecific ST abnormality Narrative Interpretation: no evidence of acute ischemia; no significant change when compared to EKG of 07/10/2024   RADIOLOGY  Chest x-ray: I independently viewed and interpreted the images; there is no focal consolidation or edema  PROCEDURES:  Critical Care performed: No  Procedures   MEDICATIONS ORDERED IN ED: Medications - No data to display   IMPRESSION / MDM / ASSESSMENT AND PLAN / ED COURSE  I reviewed the triage vital signs and the nursing notes.  28 year old female with PMH as noted above presents with atypical chest discomfort earlier this morning that has now resolved.  She is currently asymptomatic.  Vital signs are normal.  She is overall well-appearing.  Physical exam is unremarkable.  EKG is nonischemic.  Differential diagnosis includes, but is not limited to, GERD, musculoskeletal chest pain, other benign etiology.  There is no clinical evidence for ACS.  I have a very low suspicion for PE.  The patient is PERC negative.  There is no evidence  of aortic dissection or other vascular cause.  Will obtain basic labs, troponin, chest x-ray, and reassess.  Patient's presentation is most consistent with acute complicated illness / injury requiring diagnostic workup.  ----------------------------------------- 8:40 AM on 07/23/2024 -----------------------------------------  CMP is unremarkable.  CBC shows mild leukocytosis which is nonspecific.  Troponin is negative.  Chest x-ray is clear.  Given the patient's very low ACS risk, or resolved symptoms, and reassuring workup, there is no indication for serial troponins.  She is stable for discharge home at this time.  I counseled her on the results of the workup and answered all of her questions.  I give strict return precautions, and she expressed understanding.   FINAL CLINICAL IMPRESSION(S) / ED DIAGNOSES   Final diagnoses:  Chest discomfort     Rx / DC Orders   ED Discharge Orders     None        Note:  This document was prepared using Dragon voice recognition software and may include unintentional dictation errors.    Jacolyn Pae, MD 07/23/24 [phone]

## 2024-07-23 NOTE — ED Triage Notes (Signed)
 Patient ambulatory to triage with steady gait, without difficulty or distress noted; pt reports onset upper chest tightness radiating into left arm with no accomp symptoms; denies hx of same

## 2024-07-23 NOTE — Discharge Instructions (Signed)
 Return to the ER for new, worsening, or persistent severe chest discomfort or pain, difficulty breathing, weakness or lightheadedness, or any other new or worsening symptoms that concern you.  Follow-up with your primary care provider.

## 2024-08-07 ENCOUNTER — Other Ambulatory Visit: Payer: Self-pay | Admitting: General Surgery

## 2024-08-07 NOTE — Progress Notes (Signed)
 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  She presented to the emergency room.  She underwent a CT scan that showed a very small umbilical hernia.  Since then she has had some occasional umbilical pain.  She does not have any prior abdominal surgery.  She does desire to get pregnant at some point but has had difficulty due to her PCOS.    Review of Systems: A complete review of systems was obtained from the patient.  I have reviewed this information and discussed as appropriate with the patient.  See HPI as well for other ROS.  Review of Systems  Gastrointestinal:  Positive for abdominal pain.  All other systems reviewed and are negative.    Medical History: Past Medical History:  Diagnosis Date   Insulin  resistance    Obesity    Ovarian cyst    Palpitations 06/2021   evaluated by Cardiology on 08/10/21   PCOS (polycystic ovarian syndrome)    Pelvic pain    twice in 2021, and on 11-24-21    Patient Active Problem List  Diagnosis   Heart palpitations   Dizziness    Past Surgical History:  Procedure Laterality Date   wisdom tooth extraction       Allergies  Allergen Reactions   Acetaminophen -Codeine Other (See Comments)    Anxiety, upset stomach   Clomiphene  Citrate Other (See Comments)    Visual changes suspected to be related to Clomid  initiation.    Current Outpatient Medications on File Prior to Visit  Medication Sig Dispense Refill   prenatal vitamin with iron-folic acid (PRENATAL TABLETS) tablet Take 1 tablet by mouth once daily     albuterol  90 mcg/actuation inhaler  Inhale 2 inhalations into the lungs every 6 (six) hours as needed 8 g 0   atenoloL  (TENORMIN ) 25 MG tablet Take 1 tablet (25 mg total) by mouth once daily (Patient not taking: Reported on 07/09/2022) 30 tablet 1   clomiPHENE  (CLOMID ) 50 mg tablet Take 2 tablets (100 mg total) by mouth once daily starting on the 5th day of her cycle (Patient not taking: Reported on 08/07/2024) 10 tablet 0   letrozole  (FEMARA ) 2.5 mg tablet Take 2 tablets (5 mg total) by mouth once daily starting on day 3 of cycle until day 7. (Patient not taking: Reported on 08/07/2024) 10 tablet 1   medroxyPROGESTERone  (PROVERA ) 10 MG tablet Take 1 tablet (10 mg total) by mouth once daily for 10 days 10 tablet 0   medroxyPROGESTERone  (PROVERA ) 10 MG tablet Take 1 tablet (10 mg total) by mouth once daily for 10 days 10 tablet 0   medroxyPROGESTERone  (PROVERA ) 10 MG tablet Take 1 tablet (10 mg total) by mouth once daily for 10 days 10 tablet 0   metFORMIN  (GLUCOPHAGE -XR) 500 MG XR tablet Take two tablets, 2 times daily, for a total of 2,000 mg a day (Patient not taking: Reported on 08/07/2024) 60 tablet 1   predniSONE  (DELTASONE ) 50 MG tablet Take 50 mg by mouth daily with breakfast (Patient not taking: Reported on 08/07/2024)     No current facility-administered medications on file prior to visit.  Family History  Problem Relation Age of Onset   Thyroid  cancer Maternal Uncle      Social History   Tobacco Use  Smoking Status Former   Types: Cigarettes  Smokeless Tobacco Never     Social History   Socioeconomic History   Marital status: Single  Tobacco Use   Smoking status: Former    Types: Cigarettes   Smokeless tobacco: Never  Vaping Use   Vaping status: Never Used  Substance and Sexual Activity   Alcohol use: Not Currently   Drug use: Not Currently   Sexual activity: Defer    Birth control/protection: None   Social Drivers of Health   Housing Stability: Unknown (02/27/2024)   Housing Stability  Vital Sign    Homeless in the Last Year: No    Objective:   Vitals:   08/07/24 0910 08/07/24 0911  BP: 137/80   Pulse: 107   Temp: 36.6 C (97.9 F)   SpO2: 96%   Weight: (!) 122.8 kg (270 lb 11.2 oz)   Height: 165.1 cm (5' 5)   PainSc:  0-No pain    Body mass index is 45.05 kg/m.  Physical Exam Vitals reviewed.  Constitutional:      Appearance: Normal appearance.  Abdominal:     General: There is no distension.     Palpations: Abdomen is soft.     Tenderness: There is no abdominal tenderness.     Hernia: No hernia (cannot feel hernia due to habitus) is present.  Neurological:     Mental Status: She is alert.        Assessment and Plan:     Diagnoses and all orders for this visit:  Umbilical hernia without obstruction and without gangrene    Primary umbilical hernia repair  We discussed the small umbilical hernia.  We discussed observation for this.  She does desire to get pregnant so that would certainly be a reasonable option given the fact that there is no bowel associated with this.  Due to her symptoms she would like to get this repaired and I think that is also reasonable.  This is small and with no prior surgery I think a primary repair would be the most reasonable to avoid use of mesh and a 29 year old.  We discussed the primary repair as well as the risks associated with that including recurrence    MATTHEW CARLIN BURY, MD     "

## 2024-08-26 ENCOUNTER — Other Ambulatory Visit: Payer: Self-pay

## 2024-08-26 ENCOUNTER — Emergency Department: Admission: EM | Admit: 2024-08-26 | Discharge: 2024-08-26 | Disposition: A | Discharge status: Home / Self Care

## 2024-08-26 DIAGNOSIS — D72829 Elevated white blood cell count, unspecified: Secondary | ICD-10-CM | POA: Diagnosis not present

## 2024-08-26 DIAGNOSIS — N939 Abnormal uterine and vaginal bleeding, unspecified: Secondary | ICD-10-CM | POA: Insufficient documentation

## 2024-08-26 LAB — CBC WITH DIFFERENTIAL/PLATELET
Abs Immature Granulocytes: 0.06 K/uL (ref 0.00–0.07)
Basophils Absolute: 0.1 K/uL (ref 0.0–0.1)
Basophils Relative: 0 %
Eosinophils Absolute: 0.1 K/uL (ref 0.0–0.5)
Eosinophils Relative: 1 %
HCT: 39.9 % (ref 36.0–46.0)
Hemoglobin: 12.9 g/dL (ref 12.0–15.0)
Immature Granulocytes: 1 %
Lymphocytes Relative: 25 %
Lymphs Abs: 3.2 K/uL (ref 0.7–4.0)
MCH: 28.8 pg (ref 26.0–34.0)
MCHC: 32.3 g/dL (ref 30.0–36.0)
MCV: 89.1 fL (ref 80.0–100.0)
Monocytes Absolute: 0.7 K/uL (ref 0.1–1.0)
Monocytes Relative: 5 %
Neutro Abs: 8.7 K/uL — ABNORMAL HIGH (ref 1.7–7.7)
Neutrophils Relative %: 68 %
Platelets: 311 K/uL (ref 150–400)
RBC: 4.48 MIL/uL (ref 3.87–5.11)
RDW: 13.7 % (ref 11.5–15.5)
WBC: 12.8 K/uL — ABNORMAL HIGH (ref 4.0–10.5)
nRBC: 0 % (ref 0.0–0.2)

## 2024-08-26 LAB — URINALYSIS, ROUTINE W REFLEX MICROSCOPIC
Bilirubin Urine: NEGATIVE
Glucose, UA: NEGATIVE mg/dL
Hgb urine dipstick: NEGATIVE
Ketones, ur: NEGATIVE mg/dL
Leukocytes,Ua: NEGATIVE
Nitrite: NEGATIVE
Protein, ur: NEGATIVE mg/dL
Specific Gravity, Urine: 1.028 (ref 1.005–1.030)
pH: 5 (ref 5.0–8.0)

## 2024-08-26 LAB — BASIC METABOLIC PANEL WITH GFR
Anion gap: 9 (ref 5–15)
BUN: 15 mg/dL (ref 6–20)
CO2: 25 mmol/L (ref 22–32)
Calcium: 9.5 mg/dL (ref 8.9–10.3)
Chloride: 106 mmol/L (ref 98–111)
Creatinine, Ser: 0.65 mg/dL (ref 0.44–1.00)
GFR, Estimated: >60 mL/min
Glucose, Bld: 146 mg/dL — ABNORMAL HIGH (ref 70–99)
Potassium: 4 mmol/L (ref 3.5–5.1)
Sodium: 141 mmol/L (ref 135–145)

## 2024-08-26 LAB — PREGNANCY, URINE: Preg Test, Ur: NEGATIVE

## 2024-08-26 NOTE — ED Triage Notes (Signed)
 Pt to ED via POV from home. Pt reports vaginal bleeding that started last pm. Pt reports hx of PCOS but has been trying to get pregnant. Pt also reports known umbilical hernia that she is going for surgery next month.

## 2024-08-26 NOTE — ED Provider Notes (Signed)
 "  [facility] Provider Note    Event Date/Time   First MD Initiated Contact with Patient 08/26/24 1531     (approximate)   History   Vaginal Bleeding   HPI  Jaime Hernandez is a 29 y.o. female with a past medical history of PCOS presenting to the emergency department complaining of vaginal bleeding last night.  Patient reports that she is currently trying to get pregnant and that she was told if she had any bleeding she needed to come to the ER.  Her last menstrual period started on 07/29/2024.  She states that the bleeding has completely resolved at this time.  She denies any abdominal pain.     Physical Exam   Triage Vital Signs: ED Triage Vitals  Encounter Vitals Group     BP 08/26/24 1410 133/89     Girls Systolic BP Percentile --      Girls Diastolic BP Percentile --      Boys Systolic BP Percentile --      Boys Diastolic BP Percentile --      Pulse Rate 08/26/24 1410 100     Resp 08/26/24 1410 18     Temp 08/26/24 1410 98 F (36.7 C)     Temp Source 08/26/24 1410 Oral     SpO2 08/26/24 1410 98 %     Weight --      Height --      Head Circumference --      Peak Flow --      Pain Score 08/26/24 1409 0     Pain Loc --      Pain Education --      Exclude from Growth Chart --     Most recent vital signs: Vitals:   08/26/24 1410  BP: 133/89  Pulse: 100  Resp: 18  Temp: 98 F (36.7 C)  SpO2: 98%     General: Awake, no distress.  CV:  Good peripheral perfusion.  Resp:  Normal effort.  Abd:  No distention.  Other:     ED Results / Procedures / Treatments   Labs (all labs ordered are listed, but only abnormal results are displayed) Labs Reviewed  CBC WITH DIFFERENTIAL/PLATELET - Abnormal; Notable for the following components:      Result Value   WBC 12.8 (*)    Neutro Abs 8.7 (*)    All other components within normal limits  URINALYSIS, ROUTINE W REFLEX MICROSCOPIC - Abnormal; Notable for the following components:   Color,  Urine YELLOW (*)    APPearance CLEAR (*)    All other components within normal limits  BASIC METABOLIC PANEL WITH GFR - Abnormal; Notable for the following components:   Glucose, Bld 146 (*)    All other components within normal limits  POC URINE PREG, ED     EKG     RADIOLOGY     PROCEDURES:  Critical Care performed: No  Procedures   MEDICATIONS ORDERED IN ED: Medications - No data to display   IMPRESSION / MDM / ASSESSMENT AND PLAN / ED COURSE  I reviewed the triage vital signs and the nursing notes.                              Differential diagnosis includes, but is not limited to, implantation bleeding, menorrhagia, miscarriage, ectopic pregnancy, irregular bleeding related to PCOS  Patient's presentation is most consistent with acute, uncomplicated illness.  Patient is a 29 year old female with past medical history of PCOS presenting to the emergency department complaining of vaginal bleeding last night which has now resolved.   Patient's blood work is overall unremarkable.  Urinalysis does not show any signs concerning for infection.  Pregnancy test is negative.  Given the patient's unremarkable workup and the fact that her symptoms have resolved I do not feel there is a need for further workup or any imaging at this time.  I discussed results at length with the patient.  Discussed plan for follow-up with her OB/GYN and PCP.  Return to the emergency department for new or worsening symptoms.        FINAL CLINICAL IMPRESSION(S) / ED DIAGNOSES   Final diagnoses:  Abnormal vaginal bleeding     Rx / DC Orders   ED Discharge Orders     None        Note:  This document was prepared using Dragon voice recognition software and may include unintentional dictation errors.   Rexford Reche HERO, MD 08/26/24 2243  "

## 2024-09-03 ENCOUNTER — Other Ambulatory Visit: Payer: Self-pay

## 2024-09-03 ENCOUNTER — Encounter (HOSPITAL_COMMUNITY): Payer: Self-pay | Admitting: General Surgery

## 2024-09-03 NOTE — Progress Notes (Signed)
 PCP - [facility] but will be changing PCP Cardiologist - Dr Marsa Dooms  Chest x-ray - 07/23/24 EKG - 07/23/24 Stress Test - n/a ECHO - 08/10/21 Cardiac Cath - n/a  ICD Pacemaker/Loop - n/a  Sleep Study -  n/a  Diabetes - n/a  Aspirin & Blood Thinner Instructions:  n/a  ERAS - clear liquids til 0730 DOS.  Anesthesia review: Yes  STOP now taking any Aspirin (unless otherwise instructed by your surgeon), Aleve , Naproxen , Ibuprofen , Motrin , Advil , Goody's, BC's, all herbal medications, fish oil, and all vitamins.   Coronavirus Screening Do you have any of the following symptoms:  Cough yes/no: No Fever (>100.11F)  yes/no: No Runny nose yes/no: No Sore throat yes/no: No Difficulty breathing/shortness of breath  yes/no: No  Have you traveled in the last 14 days and where? yes/no: No  Patient verbalized understanding of instructions that were given via phone.

## 2024-09-04 NOTE — Progress Notes (Signed)
 Anesthesia Chart Review: Jaime Hernandez  Case: [id number] Date/Time: 09/07/24 1023   Procedure: REPAIR, HERNIA, UMBILICAL, ADULT - PRIMARY UMBILICAL HERNIA REPAIR   Anesthesia type: General   Pre-op diagnosis: UMBILICAL HERNIA   Location: [facility] ROOM 02 / [facility]   Surgeons: Ebbie Cough, MD       DISCUSSION: Patient is a 29 year old female scheduled for the above procedure.  History includes never smoker, PCOS, obesity, palpitations, nephrolithiasis. ADHD.  She was evaluated by cardiologist Dr. Ammon on 08/24/2022 for follow-up palpitations. 2D echocardiogram 08/10/2021 revealed normal left ventricular function, with LVEF 60-65%. ECG 06/19/2022 revealed normal sinus rhythm at 78 bpm. 7-day Holter monitor 07/09/2022 - 07/16/2022 revealed predominant sinus rhythm, with observed sinus tachycardia, with rare premature ventricular contractions. He was active, working in housekeeping. Former smoker. He deferred empiric b-blocker therapy. Advised to limit caffeine. Consider sleep study in the future.  She is for updated labs as indicated on arrival. Negative urine pregnancy test on 08/26/2024.  Anesthesia team to evaluate on the day of surgery.  VS: Ht 5' 5 (1.651 m)   Wt 124.7 kg   LMP 08/27/2024 (Exact Date)   BMI 45.76 kg/m  BP Readings from Last 3 Encounters:  08/26/24 133/88  07/23/24 126/72  07/10/24 124/77   Pulse Readings from Last 3 Encounters:  08/26/24 98  07/23/24 95  07/10/24 76    PROVIDERS: PCP - [facility] but will be changing PCP Cardiologist - Dr Marsa Ammon. Evaluated on 08/24/2022 for palpitations.    LABS: Most recent lab results in [facility] include: Lab Results  Component Value Date   WBC 12.8 (H) 08/26/2024   HGB 12.9 08/26/2024   HCT 39.9 08/26/2024   PLT 311 08/26/2024   GLUCOSE 146 (H) 08/26/2024   ALT 26 07/23/2024   AST 17 07/23/2024   NA 141 08/26/2024   K 4.0 08/26/2024   CL 106 08/26/2024   CREATININE 0.65 08/26/2024    BUN 15 08/26/2024   CO2 25 08/26/2024     IMAGES: US  Abd 07/10/2024: IMPRESSION: 1. No acute findings. 2. Mild hepatic steatosis.  CT Abd/pelvis 07/10/2024: IMPRESSION: 1. No acute findings. 2. 4 mm nonobstructing calculus in the upper pole of the left kidney. - Tiny fat-containing umbilical hernia. No acute osseous abnormality.  MRI/MRV Head 09/11/2022: PRESSION: 1. Normal brain MRI. No acute intracranial abnormality or findings to explain patient's symptoms. 2. Normal intracranial MRV. No evidence for dural sinus thrombosis.    EKG: EKG 07/24/2024: Normal sinus rhythm Nonspecific ST and T wave abnormality   CV: 7-day Holter monitor 07/09/2022 - 07/16/2022 revealed predominant sinus rhythm with mean heart rate of 88 bpm, sinus heart rate range 48 to 158 bpm, sinus tachycardia observed, with rare premature ventricular contractions.  Echo 08/10/2021: IMPRESSIONS   1. Left ventricular ejection fraction, by estimation, is 60 to 65%. The  left ventricle has normal function. The left ventricle has no regional  wall motion abnormalities. Left ventricular diastolic parameters were  normal.   2. Right ventricular systolic function is normal. The right ventricular  size is normal.   3. The mitral valve is normal in structure. No evidence of mitral valve  regurgitation. No evidence of mitral stenosis.   4. The aortic valve is normal in structure. Aortic valve regurgitation is  not visualized. No aortic stenosis is present.   5. The inferior vena cava is normal in size with greater than 50%  respiratory variability, suggesting right atrial pressure of 3 mmHg.  Past Medical History:  Diagnosis Date   ADHD (attention deficit hyperactivity disorder)    no current meds   Heart palpitations 08/2021   History of kidney stones    passed stones   Obesity    PCOS (polycystic ovarian syndrome)    Pneumonia    x 1    Past Surgical History:  Procedure Laterality Date   WISDOM  TOOTH EXTRACTION     with sedation    MEDICATIONS:  Multiple Vitamins-Minerals (MULTIVITAL PO)   NONFORMULARY OR COMPOUNDED ITEM   albuterol  (VENTOLIN  HFA) 108 (90 Base) MCG/ACT inhaler   atenolol  (TENORMIN ) 25 MG tablet   dicyclomine  (BENTYL ) 10 MG capsule   letrozole  (FEMARA ) 2.5 MG tablet   letrozole  (FEMARA ) 2.5 MG tablet   metFORMIN  (GLUCOPHAGE ) 500 MG tablet   metFORMIN  (GLUCOPHAGE -XR) 500 MG 24 hr tablet   metFORMIN  (GLUCOPHAGE -XR) 500 MG 24 hr tablet   ondansetron  (ZOFRAN -ODT) 4 MG disintegrating tablet   Meds listed as currently not taking include Zofran , metformin , Femara , Bentyl , atenolol , albuterol  HFA.   Isaiah Ruder, PA-C Surgical Short Stay/Anesthesiology [facility] Phone [phone] [facility] Phone [phone] 09/04/2024 11:58 AM

## 2024-09-04 NOTE — Anesthesia Preprocedure Evaluation (Signed)
 "                                  Anesthesia Evaluation  Patient identified by MRN, date of birth, ID band Patient awake    Reviewed: Allergy & Precautions, Patient's Chart, lab work & pertinent test results  Airway Mallampati: III  TM Distance: >3 FB Neck ROM: Full    Dental no notable dental hx.    Pulmonary    Pulmonary exam normal        Cardiovascular  Rhythm:Regular Rate:Normal  EKG 07/24/2024: Normal sinus rhythm Nonspecific ST and T wave abnormality  Hx of palpitations on Atenolol   She was evaluated by cardiologist Dr. Ammon on 08/24/2022 for follow-up palpitations. 2D echocardiogram 08/10/2021 revealed normal left ventricular function, with LVEF 60-65%. ECG 06/19/2022 revealed normal sinus rhythm at 78 bpm. 7-day Holter monitor 07/09/2022 - 07/16/2022 revealed predominant sinus rhythm, with observed sinus tachycardia, with rare premature ventricular contractions. He was active, working in housekeeping. Former smoker. He deferred empiric b-blocker therapy. Advised to limit caffeine. Consider sleep study in the future.   Neuro/Psych        ADHD   GI/Hepatic   Endo/Other    Class 3 obesity  Renal/GU Renal diseaseLab Results      Component                Value               Date                        K                        4.0                 08/26/2024                    CREATININE               0.65                08/26/2024                GFRNONAA                 >60                 08/26/2024              GLUCOSE                  146 (H)             08/26/2024           Hx of nephrolithiasis     Musculoskeletal   Abdominal  (+) + obese  Peds  (+) ADHD Hematology Lab Results      Component                Value               Date                      WBC                      12.8 (H)  08/26/2024                HGB                      12.9                08/26/2024                HCT                      39.9                 08/26/2024                MCV                      89.1                08/26/2024                PLT                      311                 08/26/2024              Anesthesia Other Findings All: clomid , codeine   Reproductive/Obstetrics                              Anesthesia Physical Anesthesia Plan  ASA: 3  Anesthesia Plan: General and Regional   Post-op Pain Management: Regional block* and Tylenol  PO (pre-op)*   Induction: Intravenous  PONV Risk Score and Plan: 3 and Ondansetron , Dexamethasone , Midazolam  and Treatment may vary due to age or medical condition  Airway Management Planned: Mask and Oral ETT  Additional Equipment: None  Intra-op Plan:   Post-operative Plan: Extubation in OR  Informed Consent: I have reviewed the patients History and Physical, chart, labs and discussed the procedure including the risks, benefits and alternatives for the proposed anesthesia with the patient or authorized representative who has indicated his/her understanding and acceptance.     Dental advisory given  Plan Discussed with: CRNA  Anesthesia Plan Comments: (PAT note written 09/04/2024 by Dollye Glasser, PA-C.  )         Anesthesia Quick Evaluation  "

## 2024-09-07 ENCOUNTER — Ambulatory Visit (HOSPITAL_COMMUNITY)
Admission: RE | Admit: 2024-09-07 | Discharge: 2024-09-07 | Disposition: A | Discharge status: Home / Self Care | Attending: General Surgery | Admitting: General Surgery

## 2024-09-07 ENCOUNTER — Encounter (HOSPITAL_COMMUNITY): Payer: Self-pay | Admitting: Vascular Surgery

## 2024-09-07 ENCOUNTER — Other Ambulatory Visit: Payer: Self-pay | Admitting: Medical Genetics

## 2024-09-07 ENCOUNTER — Encounter (HOSPITAL_COMMUNITY)
Admission: RE | Disposition: A | Discharge status: Home / Self Care | Payer: Self-pay | Source: Home / Self Care | Attending: General Surgery

## 2024-09-07 DIAGNOSIS — K429 Umbilical hernia without obstruction or gangrene: Secondary | ICD-10-CM | POA: Insufficient documentation

## 2024-09-07 DIAGNOSIS — Z79899 Other long term (current) drug therapy: Secondary | ICD-10-CM | POA: Insufficient documentation

## 2024-09-07 DIAGNOSIS — Z87891 Personal history of nicotine dependence: Secondary | ICD-10-CM | POA: Insufficient documentation

## 2024-09-07 DIAGNOSIS — Z6841 Body Mass Index (BMI) 40.0 and over, adult: Secondary | ICD-10-CM | POA: Insufficient documentation

## 2024-09-07 DIAGNOSIS — Z87442 Personal history of urinary calculi: Secondary | ICD-10-CM | POA: Insufficient documentation

## 2024-09-07 DIAGNOSIS — E66813 Obesity, class 3: Secondary | ICD-10-CM | POA: Insufficient documentation

## 2024-09-07 HISTORY — DX: Pneumonia, unspecified organism: J18.9

## 2024-09-07 HISTORY — DX: Personal history of urinary calculi: Z87.442

## 2024-09-07 HISTORY — DX: Attention-deficit hyperactivity disorder, unspecified type: F90.9

## 2024-09-07 LAB — POCT PREGNANCY, URINE: Preg Test, Ur: NEGATIVE

## 2024-09-07 MED ORDER — MIDAZOLAM HCL 2 MG/2ML IJ SOLN
INTRAMUSCULAR | Status: AC
  Filled 2024-09-07: qty 2

## 2024-09-07 MED ORDER — DEXMEDETOMIDINE HCL IN NACL 80 MCG/20ML IV SOLN
INTRAVENOUS | Status: DC | PRN
  Administered 2024-09-07 (×5): 4 ug via INTRAVENOUS

## 2024-09-07 MED ORDER — ENSURE PRE-SURGERY PO LIQD: 296.0000 mL | Freq: Once | ORAL | Status: DC

## 2024-09-07 MED ORDER — 0.9 % SODIUM CHLORIDE (POUR BTL) OPTIME
TOPICAL | Status: DC | PRN
  Administered 2024-09-07: 1000 mL

## 2024-09-07 MED ORDER — ORAL CARE MOUTH RINSE: 15.0000 mL | Freq: Once | OROMUCOSAL | Status: AC

## 2024-09-07 MED ORDER — ONDANSETRON HCL 4 MG/2ML IJ SOLN
INTRAMUSCULAR | Status: AC
  Filled 2024-09-07: qty 2

## 2024-09-07 MED ORDER — OXYCODONE HCL 5 MG PO TABS: 5.0000 mg | ORAL_TABLET | Freq: Four times a day (QID) | ORAL | 0 refills | Status: AC | PRN

## 2024-09-07 MED ORDER — BUPIVACAINE-EPINEPHRINE (PF) 0.25% -1:200000 IJ SOLN
INTRAMUSCULAR | Status: AC
  Filled 2024-09-07: qty 30

## 2024-09-07 MED ORDER — ROCURONIUM BROMIDE 10 MG/ML (PF) SYRINGE
PREFILLED_SYRINGE | INTRAVENOUS | Status: AC
  Filled 2024-09-07: qty 10

## 2024-09-07 MED ORDER — DROPERIDOL 2.5 MG/ML IJ SOLN
INTRAMUSCULAR | Status: AC
  Filled 2024-09-07: qty 2

## 2024-09-07 MED ORDER — MIDAZOLAM HCL 2 MG/2ML IJ SOLN
INTRAMUSCULAR | Status: AC
  Administered 2024-09-07: 2 mg via INTRAVENOUS
  Filled 2024-09-07: qty 2

## 2024-09-07 MED ORDER — SUGAMMADEX SODIUM 200 MG/2ML IV SOLN
INTRAVENOUS | Status: DC | PRN
  Administered 2024-09-07: 400 mg via INTRAVENOUS

## 2024-09-07 MED ORDER — MIDAZOLAM HCL (PF) 2 MG/2ML IJ SOLN: 2.0000 mg | Freq: Once | INTRAMUSCULAR | Status: AC

## 2024-09-07 MED ORDER — PHENYLEPHRINE 80 MCG/ML (10ML) SYRINGE FOR IV PUSH (FOR BLOOD PRESSURE SUPPORT)
PREFILLED_SYRINGE | INTRAVENOUS | Status: AC
  Filled 2024-09-07: qty 10

## 2024-09-07 MED ORDER — DEXAMETHASONE SOD PHOSPHATE PF 10 MG/ML IJ SOLN
INTRAMUSCULAR | Status: AC
  Filled 2024-09-07: qty 1

## 2024-09-07 MED ORDER — LIDOCAINE 2% (20 MG/ML) 5 ML SYRINGE
INTRAMUSCULAR | Status: DC | PRN
  Administered 2024-09-07: 60 mg via INTRAVENOUS

## 2024-09-07 MED ORDER — CEFAZOLIN SODIUM-DEXTROSE 3-4 GM/150ML-% IV SOLN
3.0000 g | INTRAVENOUS | Status: AC
  Administered 2024-09-07: 3 g via INTRAVENOUS
  Filled 2024-09-07: qty 150

## 2024-09-07 MED ORDER — CHLORHEXIDINE GLUCONATE 0.12 % MT SOLN
15.0000 mL | Freq: Once | OROMUCOSAL | Status: AC
  Administered 2024-09-07: 15 mL via OROMUCOSAL
  Filled 2024-09-07: qty 15

## 2024-09-07 MED ORDER — FENTANYL CITRATE (PF) 100 MCG/2ML IJ SOLN: 25.0000 ug | INTRAMUSCULAR | Status: DC | PRN

## 2024-09-07 MED ORDER — BUPIVACAINE-EPINEPHRINE 0.25% -1:200000 IJ SOLN
INTRAMUSCULAR | Status: DC | PRN
  Administered 2024-09-07: 10 mL

## 2024-09-07 MED ORDER — OXYCODONE HCL 5 MG PO TABS
5.0000 mg | ORAL_TABLET | ORAL | Status: DC | PRN
  Administered 2024-09-07: 5 mg via ORAL

## 2024-09-07 MED ORDER — ONDANSETRON HCL 4 MG/2ML IJ SOLN
INTRAMUSCULAR | Status: DC | PRN
  Administered 2024-09-07: 4 mg via INTRAVENOUS

## 2024-09-07 MED ORDER — LACTATED RINGERS IV SOLN: INTRAVENOUS | Status: DC

## 2024-09-07 MED ORDER — CHLORHEXIDINE GLUCONATE CLOTH 2 % EX PADS: 6.0000 | MEDICATED_PAD | Freq: Once | CUTANEOUS | Status: DC

## 2024-09-07 MED ORDER — PROPOFOL 10 MG/ML IV BOLUS
INTRAVENOUS | Status: AC
  Filled 2024-09-07: qty 20

## 2024-09-07 MED ORDER — PROPOFOL 10 MG/ML IV BOLUS
INTRAVENOUS | Status: DC | PRN
  Administered 2024-09-07: 170 mg via INTRAVENOUS

## 2024-09-07 MED ORDER — BUPIVACAINE HCL (PF) 0.25 % IJ SOLN
INTRAMUSCULAR | Status: DC | PRN
  Administered 2024-09-07: 60 mL via EPIDURAL

## 2024-09-07 MED ORDER — MIDAZOLAM HCL (PF) 2 MG/2ML IJ SOLN
INTRAMUSCULAR | Status: DC | PRN
  Administered 2024-09-07: 2 mg via INTRAVENOUS

## 2024-09-07 MED ORDER — OXYCODONE HCL 5 MG PO TABS
ORAL_TABLET | ORAL | Status: AC
  Filled 2024-09-07: qty 1

## 2024-09-07 MED ORDER — FENTANYL CITRATE (PF) 100 MCG/2ML IJ SOLN
INTRAMUSCULAR | Status: AC
  Administered 2024-09-07: 100 ug via INTRAVENOUS
  Filled 2024-09-07: qty 2

## 2024-09-07 MED ORDER — LIDOCAINE 2% (20 MG/ML) 5 ML SYRINGE
INTRAMUSCULAR | Status: AC
  Filled 2024-09-07: qty 5

## 2024-09-07 MED ORDER — DROPERIDOL 2.5 MG/ML IJ SOLN
0.6250 mg | Freq: Once | INTRAMUSCULAR | Status: AC | PRN
  Administered 2024-09-07: 0.625 mg via INTRAVENOUS

## 2024-09-07 MED ORDER — ROCURONIUM BROMIDE 10 MG/ML (PF) SYRINGE
PREFILLED_SYRINGE | INTRAVENOUS | Status: DC | PRN
  Administered 2024-09-07: 60 mg via INTRAVENOUS

## 2024-09-07 MED ORDER — DEXAMETHASONE SOD PHOSPHATE PF 10 MG/ML IJ SOLN
INTRAMUSCULAR | Status: DC | PRN
  Administered 2024-09-07: 8 mg via INTRAVENOUS

## 2024-09-07 MED ORDER — FENTANYL CITRATE (PF) 100 MCG/2ML IJ SOLN
INTRAMUSCULAR | Status: AC
  Filled 2024-09-07: qty 2

## 2024-09-07 MED ORDER — DEXMEDETOMIDINE HCL IN NACL 80 MCG/20ML IV SOLN
INTRAVENOUS | Status: AC
  Filled 2024-09-07: qty 20

## 2024-09-07 MED ORDER — FENTANYL CITRATE (PF) 250 MCG/5ML IJ SOLN
INTRAMUSCULAR | Status: DC | PRN
  Administered 2024-09-07: 100 ug via INTRAVENOUS

## 2024-09-07 MED ORDER — FENTANYL CITRATE (PF) 100 MCG/2ML IJ SOLN: 100.0000 ug | Freq: Once | INTRAMUSCULAR | Status: AC

## 2024-09-07 MED ORDER — ACETAMINOPHEN 500 MG PO TABS
1000.0000 mg | ORAL_TABLET | ORAL | Status: AC
  Administered 2024-09-07: 1000 mg via ORAL
  Filled 2024-09-07: qty 2

## 2024-09-07 NOTE — Transfer of Care (Signed)
 Immediate Anesthesia Transfer of Care Note  Patient: Jaime Hernandez  Procedure(s) Performed: REPAIR, HERNIA, UMBILICAL, ADULT (Abdomen)  Patient Location: PACU  Anesthesia Type:General and Regional  Level of Consciousness: awake, alert , and oriented  Airway & Oxygen Therapy: Patient Spontanous Breathing and Patient connected to face mask oxygen  Post-op Assessment: Report given to RN and Post -op Vital signs reviewed and stable  Post vital signs: Reviewed and stable  Last Vitals:  Vitals Value Taken Time  BP 131/86 09/07/24 13:15  Temp 36.6 C 09/07/24 13:15  Pulse 97 09/07/24 13:19  Resp 28 09/07/24 13:19  SpO2 93 % 09/07/24 13:19  Vitals shown include unfiled device data.  Last Pain:  Vitals:   09/07/24 1315  TempSrc:   PainSc: Asleep         Complications: No notable events documented.

## 2024-09-07 NOTE — Discharge Instructions (Signed)
 [facility]  UMBILICAL OR INGUINAL HERNIA REPAIR: POST OP INSTRUCTIONS  Always review your discharge instruction sheet given to you by the facility where your surgery was performed. IF YOU HAVE DISABILITY OR FAMILY LEAVE FORMS, YOU MUST BRING THEM TO THE OFFICE FOR PROCESSING.   DO NOT GIVE THEM TO YOUR DOCTOR.  A  prescription for pain medication may be given to you upon discharge.  Take your pain medication as prescribed, if needed.  If narcotic pain medicine is not needed, then you may take acetaminophen (Tylenol), naprosyn (Alleve) or ibuprofen (Advil) as needed. Take your usually prescribed medications unless otherwise directed. If you need a refill on your pain medication, please contact your pharmacy.  They will contact our office to request authorization. Prescriptions will not be filled after 5 pm or on week-ends. You should follow a light diet the first 24 hours after arrival home, such as soup and crackers, etc.  Be sure to include lots of fluids daily.  Resume your normal diet the day after surgery. Most patients will experience some swelling and bruising around the umbilicus or in the groin and scrotum.  Ice packs and reclining will help.  Swelling and bruising can take several days to resolve.  It is common to experience some constipation if taking pain medication after surgery.  Increasing fluid intake and taking a stool softener (such as Colace) will usually help or prevent this problem from occurring.  A mild laxative (Milk of Magnesia or Miralax) should be taken according to package directions if there are no bowel movements after 48 hours. Unless discharge instructions indicate otherwise, you may remove your bandages 48 hours after surgery, and you may shower at that time.  You may have steri-strips (small skin tapes) in place directly over the incision.  These strips should be left on the skin for 7-10 days and will come off on their own.  If your surgeon used  skin glue on the incision, you may shower in 24 hours.  The glue will flake off over the next 2-3 weeks.  Any sutures or staples will be removed at the office during your follow-up visit. ACTIVITIES:  You may resume regular (light) daily activities beginning the next day--such as daily self-care, walking, climbing stairs--gradually increasing activities as tolerated.  You may have sexual intercourse when it is comfortable.  Refrain from any heavy lifting or straining until approved by your doctor. You may drive when you are no longer taking prescription pain medication, you can comfortably wear a seatbelt, and you can safely maneuver your car and apply brakes. RETURN TO WORK:  __________________________________________________________ Bonita Quin should see your doctor in the office for a follow-up appointment approximately 2-3 weeks after your surgery.  Make sure that you call for this appointment within a day or two after you arrive home to insure a convenient appointment time. OTHER INSTRUCTIONS:  __________________________________________________________________________________________________________________________________________________________________________________________  WHEN TO CALL YOUR DOCTOR: Fever over 101.0 Inability to urinate Nausea and/or vomiting Extreme swelling or bruising Continued bleeding from incision. Increased pain, redness, or drainage from the incision  The clinic staff is available to answer your questions during regular business hours.  Please don't hesitate to call and ask to speak to one of the nurses for clinical concerns.  If you have a medical emergency, go to the nearest emergency room or call 911.  A surgeon from [facility] is always on call at the hospital   [address], [address], [place], [place]  [address] ?  [address] [address], [place], [place]   [address] [phone] ? [phone] ? FAX [phone] Web site:  [email or web address]

## 2024-09-07 NOTE — Anesthesia Procedure Notes (Signed)
 Anesthesia Regional Block: TAP block   Pre-Anesthetic Checklist: , timeout performed,  Correct Patient, Correct Site, Correct Laterality,  Correct Procedure, Correct Position, site marked,  Risks and benefits discussed,  Surgical consent,  Pre-op evaluation,  At surgeon's request and post-op pain management  Laterality: Left and Right  Prep: Dura Prep       Needles:  Injection technique: Single-shot  Needle Type: Echogenic Stimulator Needle     Needle Length: 10cm  Needle Gauge: 20     Additional Needles:   Procedures:,,,, ultrasound used (permanent image in chart),,    Narrative:  Start time: 09/07/2024 11:10 AM End time: 09/07/2024 11:14 AM Injection made incrementally with aspirations every 5 mL.  Performed by: Personally  Anesthesiologist: Dorethea Cordella SQUIBB, DO  Additional Notes: Patient identified. Risks/Benefits/Options discussed with patient including but not limited to bleeding, infection, nerve damage, failed block, incomplete pain control. Patient expressed understanding and wished to proceed. All questions were answered. Sterile technique was used throughout the entire procedure. Please see nursing notes for vital signs. Aspirated in 5cc intervals with injection for negative confirmation. Patient was given instructions on fall risk and not to get out of bed. All questions and concerns addressed with instructions to call with any issues or inadequate analgesia.

## 2024-09-07 NOTE — Interval H&P Note (Signed)
 History and Physical Interval Note:  09/07/2024 11:56 AM  Jaime Hernandez  has presented today for surgery, with the diagnosis of UMBILICAL HERNIA.  The various methods of treatment have been discussed with the patient and family. After consideration of risks, benefits and other options for treatment, the patient has consented to  Procedures with comments: REPAIR, HERNIA, UMBILICAL, ADULT (N/A) - PRIMARY UMBILICAL HERNIA REPAIR as a surgical intervention.  The patient's history has been reviewed, patient examined, no change in status, stable for surgery.  I have reviewed the patient's chart and labs.  Questions were answered to the patient's satisfaction.     Donnice Bury

## 2024-09-08 ENCOUNTER — Encounter (HOSPITAL_COMMUNITY): Payer: Self-pay | Admitting: General Surgery

## 2024-09-11 ENCOUNTER — Emergency Department

## 2024-09-11 ENCOUNTER — Emergency Department
Admission: EM | Admit: 2024-09-11 | Source: Home / Self Care | Attending: Emergency Medicine | Admitting: Emergency Medicine

## 2024-09-11 ENCOUNTER — Ambulatory Visit: Admitting: Family Medicine

## 2024-09-11 ENCOUNTER — Other Ambulatory Visit: Payer: Self-pay

## 2024-09-11 DIAGNOSIS — R42 Dizziness and giddiness: Secondary | ICD-10-CM

## 2024-09-11 LAB — URINALYSIS, ROUTINE W REFLEX MICROSCOPIC
Bacteria, UA: NONE SEEN
Bilirubin Urine: NEGATIVE
Glucose, UA: NEGATIVE mg/dL
Ketones, ur: NEGATIVE mg/dL
Leukocytes,Ua: NEGATIVE
Nitrite: NEGATIVE
Protein, ur: NEGATIVE mg/dL
RBC / HPF: >50 RBC/hpf (ref 0–5)
Specific Gravity, Urine: 1.026 (ref 1.005–1.030)
pH: 5 (ref 5.0–8.0)

## 2024-09-11 LAB — CBC
HCT: 43.3 % (ref 36.0–46.0)
Hemoglobin: 14.2 g/dL (ref 12.0–15.0)
MCH: 28.8 pg (ref 26.0–34.0)
MCHC: 32.8 g/dL (ref 30.0–36.0)
MCV: 87.8 fL (ref 80.0–100.0)
Platelets: 353 10*3/uL (ref 150–400)
RBC: 4.93 MIL/uL (ref 3.87–5.11)
RDW: 13.5 % (ref 11.5–15.5)
WBC: 15.2 10*3/uL — ABNORMAL HIGH (ref 4.0–10.5)
nRBC: 0 % (ref 0.0–0.2)

## 2024-09-11 LAB — LACTIC ACID, PLASMA
Lactic Acid, Venous: 2 mmol/L (ref 0.5–1.9)
Lactic Acid, Venous: 0.7 mmol/L (ref 0.5–1.9)

## 2024-09-11 LAB — COMPREHENSIVE METABOLIC PANEL WITH GFR
ALT: 77 U/L — ABNORMAL HIGH (ref 0–44)
AST: 30 U/L (ref 15–41)
Albumin: 4.2 g/dL (ref 3.5–5.0)
Alkaline Phosphatase: 104 U/L (ref 38–126)
Anion gap: 14 (ref 5–15)
BUN: 13 mg/dL (ref 6–20)
CO2: 23 mmol/L (ref 22–32)
Calcium: 9.5 mg/dL (ref 8.9–10.3)
Chloride: 99 mmol/L (ref 98–111)
Creatinine, Ser: 0.65 mg/dL (ref 0.44–1.00)
GFR, Estimated: >60 mL/min
Glucose, Bld: 180 mg/dL — ABNORMAL HIGH (ref 70–99)
Potassium: 3.6 mmol/L (ref 3.5–5.1)
Sodium: 135 mmol/L (ref 135–145)
Total Bilirubin: 0.4 mg/dL (ref 0.0–1.2)
Total Protein: 7.5 g/dL (ref 6.5–8.1)

## 2024-09-11 LAB — POC URINE PREG, ED

## 2024-09-11 LAB — TROPONIN T, HIGH SENSITIVITY
Troponin T High Sensitivity: <6 ng/L (ref 0–19)
Troponin T High Sensitivity: <6 ng/L (ref 0–19)

## 2024-09-11 LAB — LIPASE, BLOOD: Lipase: 19 U/L (ref 11–51)

## 2024-09-11 MED ORDER — IOHEXOL 300 MG/ML  SOLN
100.0000 mL | Freq: Once | INTRAMUSCULAR | Status: AC | PRN
  Administered 2024-09-11: 100 mL via INTRAVENOUS

## 2024-09-11 MED ORDER — SODIUM CHLORIDE 0.9 % IV BOLUS
1000.0000 mL | Freq: Once | INTRAVENOUS | Status: AC
  Administered 2024-09-11: 1000 mL via INTRAVENOUS

## 2024-09-11 MED ORDER — IOHEXOL 350 MG/ML SOLN
80.0000 mL | Freq: Once | INTRAVENOUS | Status: AC | PRN
  Administered 2024-09-11: 80 mL via INTRAVENOUS

## 2024-09-11 MED ORDER — ONDANSETRON 4 MG PO TBDP
4.0000 mg | ORAL_TABLET | Freq: Once | ORAL | Status: AC
  Administered 2024-09-11: 4 mg via ORAL
  Filled 2024-09-11: qty 1

## 2024-09-11 NOTE — ED Provider Notes (Incomplete)
----------------------------------------- °  11:40 PM on 09/11/2024 -----------------------------------------  Blood pressure 122/67, pulse (!) 110, temperature 98.9 F (37.2 C), temperature source Oral, resp. rate 19, height 1.651 m (5' 5), weight 123 kg, last menstrual period 08/27/2024, SpO2 100%.   Assuming care from Dr. Jacolyn.  In short, Jaime Hernandez is a 29 y.o. female with a chief complaint of intermittent tachycardia, palpitations, feeling like she is going to pass out, in the setting of recent surgery.  Refer to the original H&P for additional details.  The current plan of care is to follow-up on CTA chest and reassess..        Medications  ondansetron  (ZOFRAN -ODT) disintegrating tablet 4 mg (4 mg Oral Given 09/11/24 1654)  sodium chloride  0.9 % bolus 1,000 mL (1,000 mLs Intravenous New Bag/Given 09/11/24 1849)  iohexol  (OMNIPAQUE ) 300 MG/ML solution 100 mL (100 mLs Intravenous Contrast Given 09/11/24 1900)  iohexol  (OMNIPAQUE ) 350 MG/ML injection 80 mL (80 mLs Intravenous Contrast Given 09/11/24 2343)     ED Discharge Orders     None      Final diagnoses:  Dizziness

## 2024-09-11 NOTE — ED Provider Triage Note (Signed)
 Emergency Medicine Provider Triage Evaluation Note  Jaime Hernandez , a 29 y.o. female  was evaluated in triage.  Pt complains of menstrual cycle lasting 15 days.  4 days ago she had a an umbilical hernia repair.  Today she is having pain in the area with nausea, vomiting, dizziness, and palpitations.  No known fever.  Physical Exam  BP (!) 133/93   Pulse (!) 114   Temp 98.2 F (36.8 C) (Oral)   Resp 19   Ht 5' 5 (1.651 m)   Wt 123 kg   LMP 08/27/2024 (Exact Date)   SpO2 100%   BMI 45.12 kg/m  Gen:   Awake, no distress   Resp:  Normal effort  MSK:   Moves extremities without difficulty  Other:    Medical Decision Making  Medically screening exam initiated at 4:52 PM.  Appropriate orders placed.  [name] was informed that the remainder of the evaluation will be completed by another provider, this initial triage assessment does not replace that evaluation, and the importance of remaining in the ED until their evaluation is complete.  Abdominal protocol started   Jaime Hernandez, [facility] 09/11/24 2051

## 2024-09-11 NOTE — ED Provider Notes (Signed)
 "  [facility] Provider Note    Event Date/Time   First MD Initiated Contact with Patient 09/11/24 1812     (approximate)   History   Abdominal Pain   HPI  Jaime Hernandez is a 29 y.o. female with a history of PCOS and recent hernia repair 4 days ago who presents with palpitations and dizziness.  The patient states that she was sitting watching TV when she suddenly started to feel palpitations.  She then became lightheaded and felt some shortness of breath.  She also reports some abdominal cramping at that time.  The patient reports that she has had heavy vaginal bleeding that started with her menstrual period but has been persisting for 2 weeks at this time.  She has not had heavy periods like this before.  She states that the palpitations have resolved and the lightheadedness is improving.  She has not had any chest pain.  She does not feel short of breath currently.  She has no leg swelling.  She states that she was feeling fine after the surgery until today.  I reviewed the past medical records.  The patient had surgery for an umbilical hernia on 2/2 which was uncomplicated.   Physical Exam   Triage Vital Signs: ED Triage Vitals  Encounter Vitals Group     BP 09/11/24 1644 (!) 133/93     Girls Systolic BP Percentile --      Girls Diastolic BP Percentile --      Boys Systolic BP Percentile --      Boys Diastolic BP Percentile --      Pulse Rate 09/11/24 1644 (!) 114     Resp 09/11/24 1644 19     Temp 09/11/24 1644 98.2 F (36.8 C)     Temp Source 09/11/24 1644 Oral     SpO2 09/11/24 1644 100 %     Weight 09/11/24 1643 273 lb (123.8 kg)     Height 09/11/24 1643 5' 5 (1.651 m)     Head Circumference --      Peak Flow --      Pain Score 09/11/24 1648 6     Pain Loc --      Pain Education --      Exclude from Growth Chart --     Most recent vital signs: Vitals:   09/11/24 2139 09/11/24 2317  BP:  122/67  Pulse: 96 (!) 110  Resp:    Temp:     SpO2: 100% 100%     General: Alert, well-appearing, no distress.  CV:  Good peripheral perfusion.  Normal heart sounds. Resp:  Normal effort.  Lungs CTAB. Abd:  Soft with mild diffuse discomfort but no focal tenderness.  Incision sites clean, dry, intact.  No distention.  Other:  No peripheral edema.  No calf or popliteal swelling or tenderness.   ED Results / Procedures / Treatments   Labs (all labs ordered are listed, but only abnormal results are displayed) Labs Reviewed  COMPREHENSIVE METABOLIC PANEL WITH GFR - Abnormal; Notable for the following components:      Result Value   Glucose, Bld 180 (*)    ALT 77 (*)    All other components within normal limits  CBC - Abnormal; Notable for the following components:   WBC 15.2 (*)    All other components within normal limits  URINALYSIS, ROUTINE W REFLEX MICROSCOPIC - Abnormal; Notable for the following components:   Color, Urine YELLOW (*)  APPearance HAZY (*)    Hgb urine dipstick LARGE (*)    All other components within normal limits  LACTIC ACID, PLASMA - Abnormal; Notable for the following components:   Lactic Acid, Venous 2.0 (*)    All other components within normal limits  LIPASE, BLOOD  LACTIC ACID, PLASMA  POC URINE PREG, ED  TROPONIN T, HIGH SENSITIVITY  TROPONIN T, HIGH SENSITIVITY     EKG  ED ECG REPORT I, Waylon Cassis, the attending physician, personally viewed and interpreted this ECG.  Date: 09/11/2024 EKG Time: 1650 Rate: 120 Rhythm: Tachycardia QRS Axis: Right axis Intervals: normal ST/T Wave abnormalities: normal Narrative Interpretation: no evidence of acute ischemia    RADIOLOGY  CT abdomen/pelvis: I independently viewed and interpreted the images; there are no dilated bowel loops or any free air or free fluid.  Radiology report indicates no acute abnormality  IMPRESSION:  1. Postsurgical changes in the periumbilical region. No drainable fluid  collection or abscess.  2. 5 mm  nonobstructing left renal calculus. No hydronephrosis.  3. No acute findings.    CTA chest: Pending  PROCEDURES:  Critical Care performed: No  Procedures   MEDICATIONS ORDERED IN ED: Medications  ondansetron  (ZOFRAN -ODT) disintegrating tablet 4 mg (4 mg Oral Given 09/11/24 1654)  sodium chloride  0.9 % bolus 1,000 mL (1,000 mLs Intravenous New Bag/Given 09/11/24 1849)  iohexol  (OMNIPAQUE ) 300 MG/ML solution 100 mL (100 mLs Intravenous Contrast Given 09/11/24 1900)  iohexol  (OMNIPAQUE ) 350 MG/ML injection 80 mL (80 mLs Intravenous Contrast Given 09/11/24 2343)     IMPRESSION / MDM / ASSESSMENT AND PLAN / ED COURSE  I reviewed the triage vital signs and the nursing notes.  29 year old female with PMH as noted above presents with palpitations, lightheadedness, and abdominal cramping acute onset this afternoon, now resolving.  She has also had some menstrual bleeding that has persisted for the last 2 weeks.  On exam currently, she is overall well-appearing.  Vital signs are normal except for tachycardia.  Abdomen soft with no focal tenderness.  Differential diagnosis includes, but is not limited to, anemia related to blood loss, dehydration, hypovolemia, electrolyte abnormality, vasovagal episode, other near syncope, less likely postoperative complication such as infection or sepsis.  I have a low suspicion for PE given that the symptoms have almost completely resolved at this point.  She has no chest pain or shortness of breath.    CBC shows leukocytosis but a normal hemoglobin.  CMP is unremarkable.  Lipase is normal.  EKG is nonischemic.  Lactate is borderline elevated.  Urinalysis just shows some RBCs.  I have added on a troponin, and we will obtain a CT abdomen/pelvis to evaluate for infection or other postoperative complication.  Patient's presentation is most consistent with acute complicated illness / injury requiring diagnostic workup.  The patient is on the cardiac monitor to evaluate  for evidence of arrhythmia and/or significant heart rate changes.  ----------------------------------------- 11:44 PM on 09/11/2024 -----------------------------------------  CT is negative.  Repeat lactate is normal.  Troponins are negative.  Initially the patient was feeling significantly better and her tachycardia had resolved.  However now on reassessment she once again is tachycardic to the 110s and feels quite dizzy and weak.  This raises the concern for PE.  We will obtain a CTA to rule out PE and continue the fluids (the patient has only gotten about 200 mL of her liter)  I have signed her out to the oncoming ED physician Dr. Gordan.   FINAL  CLINICAL IMPRESSION(S) / ED DIAGNOSES   Final diagnoses:  Dizziness     Rx / DC Orders   ED Discharge Orders     None        Note:  This document was prepared using Dragon voice recognition software and may include unintentional dictation errors.    Jacolyn Pae, MD 09/11/24 2344  "

## 2024-09-11 NOTE — ED Notes (Signed)
 Dr Floy notified of lactic 2.0. orders to be placed as needed.

## 2024-09-11 NOTE — ED Triage Notes (Signed)
 PT complains of abd pain, cramping and heavy menstrual cycle x15 days. Vomiting, dizziness and heart palpitations all started today. Pt had hernia repair surgery on Monday and has felt find until today.

## 2024-10-05 ENCOUNTER — Encounter: Admitting: Obstetrics
